# Patient Record
Sex: Female | Born: 1990 | Hispanic: No | Marital: Single | Smoking: Former smoker
Health system: Southern US, Community
[De-identification: ages and names within clinical notes are randomized; demographics above are authoritative.]

## PROBLEM LIST (undated history)

## (undated) DIAGNOSIS — F419 Anxiety disorder, unspecified: Secondary | ICD-10-CM

## (undated) DIAGNOSIS — J45909 Unspecified asthma, uncomplicated: Secondary | ICD-10-CM

## (undated) DIAGNOSIS — F32A Depression, unspecified: Secondary | ICD-10-CM

## (undated) DIAGNOSIS — T7840XA Allergy, unspecified, initial encounter: Secondary | ICD-10-CM

## (undated) DIAGNOSIS — F329 Major depressive disorder, single episode, unspecified: Secondary | ICD-10-CM

## (undated) HISTORY — DX: Depression, unspecified: F32.A

## (undated) HISTORY — PX: CERVICAL BIOPSY  W/ LOOP ELECTRODE EXCISION: SUR135

## (undated) HISTORY — DX: Allergy, unspecified, initial encounter: T78.40XA

## (undated) HISTORY — DX: Major depressive disorder, single episode, unspecified: F32.9

## (undated) HISTORY — DX: Unspecified asthma, uncomplicated: J45.909

---

## 2008-04-08 ENCOUNTER — Emergency Department (HOSPITAL_COMMUNITY): Admission: EM | Admit: 2008-04-08 | Discharge: 2008-04-08 | Payer: Self-pay | Admitting: Emergency Medicine

## 2008-05-27 ENCOUNTER — Emergency Department (HOSPITAL_COMMUNITY): Admission: EM | Admit: 2008-05-27 | Discharge: 2008-05-27 | Payer: Self-pay | Admitting: Emergency Medicine

## 2011-04-25 LAB — URINALYSIS, ROUTINE W REFLEX MICROSCOPIC
Glucose, UA: NEGATIVE
Hgb urine dipstick: NEGATIVE
Specific Gravity, Urine: 1.01

## 2011-04-25 LAB — PREGNANCY, URINE: Preg Test, Ur: NEGATIVE

## 2015-08-10 ENCOUNTER — Ambulatory Visit (INDEPENDENT_AMBULATORY_CARE_PROVIDER_SITE_OTHER): Payer: BLUE CROSS/BLUE SHIELD | Admitting: Emergency Medicine

## 2015-08-10 VITALS — BP 118/76 | HR 78 | Temp 98.2°F | Resp 16 | Ht 61.0 in | Wt 130.0 lb

## 2015-08-10 DIAGNOSIS — H60391 Other infective otitis externa, right ear: Secondary | ICD-10-CM

## 2015-08-10 MED ORDER — HYDROCODONE-ACETAMINOPHEN 5-325 MG PO TABS
1.0000 | ORAL_TABLET | ORAL | Status: DC | PRN
Start: 2015-08-10 — End: 2016-09-26

## 2015-08-10 MED ORDER — MUPIROCIN 2 % EX OINT: 1.0000 "application " | TOPICAL_OINTMENT | Freq: Three times a day (TID) | CUTANEOUS | Status: DC

## 2015-08-10 MED ORDER — TRIAMCINOLONE ACETONIDE 0.1 % EX CREA: 1.0000 "application " | TOPICAL_CREAM | Freq: Two times a day (BID) | CUTANEOUS | Status: DC

## 2015-08-10 NOTE — Progress Notes (Signed)
Subjective:  Patient ID: Sally Cardenas, female    DOB: [birth year]  Age: 25 y.o. MRN: [id number]  CC: Ear Pain   HPI Sally Cardenas presents   With pain in her right ear. She has a history of eczema and think she has eczema right ear canal that she's been instrumenting with Q-tips in her finger and is now quite painful and tender. As she postnasal drainage or fever. She has no cough wheezing or shortness of breath. No drainage from her ear.  History Sally Cardenas has a past medical history of Allergy; Asthma; and Depression.   She has no past surgical history on file.   Her  family history includes Cancer in her father; Hyperlipidemia in her mother; Hypertension in her mother.  She   reports that she quit smoking about 4 weeks ago. She has never used smokeless tobacco. She reports that she does not drink alcohol or use illicit drugs.  No outpatient prescriptions prior to visit.   No facility-administered medications prior to visit.    Social History   Social History  . Marital Status: Single    Spouse Name: N/A  . Number of Children: N/A  . Years of Education: N/A   Social History Main Topics  . Smoking status: Former Smoker    Quit date: 07/10/2015  . Smokeless tobacco: Never Used  . Alcohol Use: No  . Drug Use: No  . Sexual Activity: Not Asked   Other Topics Concern  . None   Social History Narrative  . None     Review of Systems  Constitutional: Negative for fever, chills and appetite change.  HENT: Positive for ear pain. Negative for congestion, ear discharge, postnasal drip, sinus pressure and sore throat.   Eyes: Negative for pain and redness.  Respiratory: Negative for cough, shortness of breath and wheezing.   Cardiovascular: Negative for leg swelling.  Gastrointestinal: Negative for nausea, vomiting, abdominal pain, diarrhea, constipation and blood in stool.  Endocrine: Negative for polyuria.  Genitourinary: Negative for dysuria, urgency, frequency  and flank pain.  Musculoskeletal: Negative for gait problem.  Skin: Negative for rash.  Neurological: Negative for weakness and headaches.  Psychiatric/Behavioral: Negative for confusion and decreased concentration. The patient is not nervous/anxious.     Objective:  BP 118/76 mmHg  Pulse 78  Temp(Src) 98.2 F (36.8 C) (Oral)  Resp 16  Ht 5\' 1"  (1.549 m)  Wt 130 lb (58.968 kg)  BMI 24.58 kg/m2  SpO2 98%  LMP 06/28/2015 (Approximate)  Physical Exam  Constitutional: She is oriented to person, place, and time. She appears well-developed and well-nourished. No distress.  HENT:  Head: Normocephalic and atraumatic.  Right Ear: External ear normal.  Left Ear: External ear normal.  Nose: Nose normal.   She has an area of swelling and involving the tragus. She also has an area at 2:00 on her external ear canal of swelling and scaling and crusting. Her drum itself is normal. The lesion in her ear is lesion her ear is very tender  Eyes: Conjunctivae and EOM are normal. Pupils are equal, round, and reactive to light. No scleral icterus.  Neck: Normal range of motion. Neck supple. No tracheal deviation present.  Cardiovascular: Normal rate, regular rhythm and normal heart sounds.   Pulmonary/Chest: Effort normal. No respiratory distress. She has no wheezes. She has no rales.  Abdominal: She exhibits no mass. There is no tenderness. There is no rebound and no guarding.  Musculoskeletal: She exhibits no edema.  Lymphadenopathy:  She has no cervical adenopathy.  Neurological: She is alert and oriented to person, place, and time. Coordination normal.  Skin: Skin is warm and dry. No rash noted.  Psychiatric: She has a normal mood and affect. Her behavior is normal.      Assessment & Plan:   Sally CornfieldStephanie was seen today for ear pain.  Diagnoses and all orders for this visit:  Otitis, externa, infective, right  Other orders -     HYDROcodone-acetaminophen (NORCO) 5-325 MG tablet; Take  1-2 tablets by mouth every 4 (four) hours as needed. -     mupirocin ointment (BACTROBAN) 2 %; Apply 1 application topically 3 (three) times daily. -     triamcinolone cream (KENALOG) 0.1 %; Apply 1 application topically 2 (two) times daily.   I am having Ms. Sally Normanran start on HYDROcodone-acetaminophen, mupirocin ointment, and triamcinolone cream. I am also having her maintain her albuterol, escitalopram, montelukast, and Fluticasone-Salmeterol.  Meds ordered this encounter  Medications  . albuterol (PROVENTIL HFA;VENTOLIN HFA) 108 (90 Base) MCG/ACT inhaler    Sig: Inhale into the lungs every 6 (six) hours as needed for wheezing or shortness of breath.  . escitalopram (LEXAPRO) 10 MG tablet    Sig: Take 10 mg by mouth daily.  . montelukast (SINGULAIR) 10 MG tablet    Sig: Take 10 mg by mouth at bedtime.  . Fluticasone-Salmeterol (ADVAIR) 250-50 MCG/DOSE AEPB    Sig: Inhale 1 puff into the lungs 2 (two) times daily.  Marland Kitchen. HYDROcodone-acetaminophen (NORCO) 5-325 MG tablet    Sig: Take 1-2 tablets by mouth every 4 (four) hours as needed.    Dispense:  20 tablet    Refill:  0  . mupirocin ointment (BACTROBAN) 2 %    Sig: Apply 1 application topically 3 (three) times daily.    Dispense:  22 g    Refill:  1  . triamcinolone cream (KENALOG) 0.1 %    Sig: Apply 1 application topically 2 (two) times daily.    Dispense:  30 g    Refill:  0    Appropriate red flag conditions were discussed with the patient as well as actions that should be taken.  Patient expressed his understanding.  Follow-up: Return if symptoms worsen or fail to improve.  Carmelina DaneAnderson, Maely Clements S, MD

## 2015-08-10 NOTE — Patient Instructions (Signed)

## 2016-09-25 ENCOUNTER — Encounter (HOSPITAL_COMMUNITY): Payer: Self-pay | Admitting: *Deleted

## 2016-09-25 ENCOUNTER — Observation Stay (HOSPITAL_COMMUNITY)
Admission: AD | Admit: 2016-09-25 | Discharge: 2016-09-26 | Disposition: A | Discharge status: Home / Self Care | Payer: BLUE CROSS/BLUE SHIELD | Source: Ambulatory Visit | Attending: Obstetrics and Gynecology | Admitting: Obstetrics and Gynecology

## 2016-09-25 DIAGNOSIS — J45909 Unspecified asthma, uncomplicated: Secondary | ICD-10-CM | POA: Insufficient documentation

## 2016-09-25 DIAGNOSIS — Z87891 Personal history of nicotine dependence: Secondary | ICD-10-CM | POA: Insufficient documentation

## 2016-09-25 DIAGNOSIS — N939 Abnormal uterine and vaginal bleeding, unspecified: Principal | ICD-10-CM | POA: Diagnosis present

## 2016-09-25 DIAGNOSIS — Z9889 Other specified postprocedural states: Secondary | ICD-10-CM

## 2016-09-25 DIAGNOSIS — N888 Other specified noninflammatory disorders of cervix uteri: Secondary | ICD-10-CM

## 2016-09-25 DIAGNOSIS — Z79899 Other long term (current) drug therapy: Secondary | ICD-10-CM | POA: Insufficient documentation

## 2016-09-25 NOTE — MAU Note (Signed)
Pt states she had a LEEP done on Friday by Dr. Sallye OberKulwa, states she thinks she started period next day, but unsure due to the bleeding from procedure. Pt was due for period around the same time. States for the last couple of hours she has been changing pad every 30-45 mins. Passed a clot the size of a small orange and a few small clots. Pt denies pain, just some light cramping-rates 2/10.

## 2016-09-26 ENCOUNTER — Observation Stay (HOSPITAL_COMMUNITY): Payer: BLUE CROSS/BLUE SHIELD

## 2016-09-26 DIAGNOSIS — N939 Abnormal uterine and vaginal bleeding, unspecified: Secondary | ICD-10-CM

## 2016-09-26 DIAGNOSIS — J45909 Unspecified asthma, uncomplicated: Secondary | ICD-10-CM | POA: Diagnosis not present

## 2016-09-26 DIAGNOSIS — Z79899 Other long term (current) drug therapy: Secondary | ICD-10-CM | POA: Diagnosis not present

## 2016-09-26 DIAGNOSIS — Z87891 Personal history of nicotine dependence: Secondary | ICD-10-CM | POA: Diagnosis not present

## 2016-09-26 HISTORY — DX: Abnormal uterine and vaginal bleeding, unspecified: N93.9

## 2016-09-26 LAB — URINALYSIS, ROUTINE W REFLEX MICROSCOPIC
Bilirubin Urine: NEGATIVE
GLUCOSE, UA: NEGATIVE mg/dL
Ketones, ur: NEGATIVE mg/dL
LEUKOCYTES UA: NEGATIVE
Nitrite: NEGATIVE
PROTEIN: NEGATIVE mg/dL
SPECIFIC GRAVITY, URINE: 1.02 (ref 1.005–1.030)
pH: 5.5 (ref 5.0–8.0)

## 2016-09-26 LAB — CBC
HEMATOCRIT: 30.7 % — AB (ref 36.0–46.0)
HEMATOCRIT: 33.4 % — AB (ref 36.0–46.0)
HEMOGLOBIN: 11.4 g/dL — AB (ref 12.0–15.0)
Hemoglobin: 10.7 g/dL — ABNORMAL LOW (ref 12.0–15.0)
MCH: 26.3 pg (ref 26.0–34.0)
MCH: 27.3 pg (ref 26.0–34.0)
MCHC: 34.1 g/dL (ref 30.0–36.0)
MCHC: 34.9 g/dL (ref 30.0–36.0)
MCV: 77 fL — ABNORMAL LOW (ref 78.0–100.0)
MCV: 78.3 fL (ref 78.0–100.0)
Platelets: 152 10*3/uL (ref 150–400)
Platelets: 167 10*3/uL (ref 150–400)
RBC: 3.92 MIL/uL (ref 3.87–5.11)
RBC: 4.34 MIL/uL (ref 3.87–5.11)
RDW: 14.3 % (ref 11.5–15.5)
RDW: 14.3 % (ref 11.5–15.5)
WBC: 11.9 10*3/uL — AB (ref 4.0–10.5)
WBC: 13.2 10*3/uL — ABNORMAL HIGH (ref 4.0–10.5)

## 2016-09-26 LAB — URINALYSIS, MICROSCOPIC (REFLEX)

## 2016-09-26 MED ORDER — FERRIC SUBSULFATE 259 MG/GM EX SOLN
CUTANEOUS | Status: AC
  Filled 2016-09-26: qty 8

## 2016-09-26 MED ORDER — FERRIC SUBSULFATE 259 MG/GM EX SOLN
CUTANEOUS | Status: AC
  Filled 2016-09-26: qty 24

## 2016-09-26 MED ORDER — OXYCODONE-ACETAMINOPHEN 5-325 MG PO TABS
1.0000 | ORAL_TABLET | ORAL | Status: DC | PRN
  Administered 2016-09-26: 1 via ORAL
  Filled 2016-09-26: qty 1

## 2016-09-26 MED ORDER — ONDANSETRON HCL 4 MG/2ML IJ SOLN: 4.0000 mg | Freq: Four times a day (QID) | INTRAMUSCULAR | Status: DC | PRN

## 2016-09-26 MED ORDER — TRANEXAMIC ACID 650 MG PO TABS
1300.0000 mg | ORAL_TABLET | Freq: Three times a day (TID) | ORAL | Status: DC
  Administered 2016-09-26 (×2): 1300 mg via ORAL
  Filled 2016-09-26 (×5): qty 2

## 2016-09-26 MED ORDER — OXYCODONE-ACETAMINOPHEN 5-325 MG PO TABS: 1.0000 | ORAL_TABLET | ORAL | Status: DC | PRN

## 2016-09-26 MED ORDER — ONDANSETRON HCL 4 MG PO TABS: 4.0000 mg | ORAL_TABLET | Freq: Four times a day (QID) | ORAL | Status: DC | PRN

## 2016-09-26 MED ORDER — SENNA 8.6 MG PO TABS
1.0000 | ORAL_TABLET | Freq: Two times a day (BID) | ORAL | Status: DC
  Administered 2016-09-26: 8.6 mg via ORAL
  Filled 2016-09-26 (×5): qty 1

## 2016-09-26 NOTE — Progress Notes (Signed)
Pt has had no bleeding since removal of vaginal packing by Dr. Su Hiltoberts at around 11am. Pt has no complaints. Pt verbalizes understanding of d/c instructions, medications, follow up appts, when to seek medical attention, belongings policy. Pt has no questions at this time. Pt was given a copy of d/c instructions when she left. Pt and her SO ambulated to main entrance, I accompanied them without complication. Pt walked from main entrance to their vehicle at her request, which was close. Sheryn BisonGordon, Aarya Quebedeaux Warner

## 2016-09-26 NOTE — Discharge Summary (Signed)
Physician Discharge Summary  Patient ID: Sally NakayamaStephanie Cardenas MRN: [id number] DOB/AGE: 1991-02-28 26 y.o.  Admit date: 09/25/2016 Discharge date: 09/26/2016  Admission Diagnoses: Vaginal bleeding S/p LEEP on Friday  Discharge Diagnoses:  Active Problems:   Vaginal bleeding s/p LEEP  Discharged Condition: good  Hospital Course: Pt was observed in the hospital after monsels and vaginal packing were placed in the vagina for hemostasis.  Upon removal of packing about 8hrs later, there was minimal bleeding and packing had minimal bright blood on it and mostly dark old blood.  The patient was on her cycle but no active bleeding noted at the time of removal of the vaginal packing.  Consults: None  Significant Diagnostic Studies: 10.7 Hgb  Treatments: observation and vaginal packing  Discharge Exam: Blood pressure 111/76, pulse 63, temperature 98.2 F (36.8 C), temperature source Oral, resp. rate 16, height 5' (1.524 m), weight 120 lb 8 oz (54.7 kg), last menstrual period 09/24/2016, SpO2 100 %. General appearance: alert and no distress Resp: clear to auscultation bilaterally Cardio: regular rate and rhythm Pelvic: cervix normal in appearance, external genitalia normal, no adnexal masses or tenderness, no cervical motion tenderness, rectovaginal septum normal, uterus normal size, shape, and consistency, vagina normal without discharge and packing removed and no active bleeding noted Extremities: Homans sign is negative, no sign of DVT  Disposition: stable   Allergies as of 09/26/2016   No Known Allergies     Medication List    STOP taking these medications   Fluticasone-Salmeterol 250-50 MCG/DOSE Aepb Commonly known as:  ADVAIR   HYDROcodone-acetaminophen 5-325 MG tablet Commonly known as:  NORCO     TAKE these medications   albuterol 108 (90 Base) MCG/ACT inhaler Commonly known as:  PROVENTIL HFA;VENTOLIN HFA Inhale into the lungs every 6 (six) hours as needed for wheezing or  shortness of breath.   escitalopram 10 MG tablet Commonly known as:  LEXAPRO Take 10 mg by mouth daily.   ibuprofen 800 MG tablet Commonly known as:  ADVIL,MOTRIN Take 800 mg by mouth every 8 (eight) hours as needed.   montelukast 10 MG tablet Commonly known as:  SINGULAIR Take 10 mg by mouth at bedtime.   mupirocin ointment 2 % Commonly known as:  BACTROBAN Apply 1 application topically 3 (three) times daily.   triamcinolone cream 0.1 % Commonly known as:  KENALOG Apply 1 application topically 2 (two) times daily.        SignedPurcell Nails: Johnatan Baskette Y 09/26/2016, 11:00 AM

## 2016-09-26 NOTE — MAU Provider Note (Signed)
Chief Complaint: Vaginal Bleeding   First Provider Initiated Contact with Patient 09/26/16 0042      SUBJECTIVE HPI: Sally Cardenas is a 26 y.o. G0P0000 who is s/p LEEP on 09/23/16 presents to maternity admissions reporting onset of vaginal bleeding on 09/24/16 becoming much heavier today. This is a new symptom.  She reports her menses were due but she never has bleeding this heavy.  She reports passing one orange sized clot and several smaller clots and has changed her pad every 30-45 minutes since 8 pm today.  She has not tried any treatments and nothing makes the bleeding better or worse.  It has no associated symptoms. She denies abdominal pain.  She denies h/a, dizziness, chest pain, or palpitations. She denies vaginal itching/burning, urinary symptoms, n/v, or fever/chills.     HPI  Past Medical History:  Diagnosis Date  . Allergy   . Asthma   . Depression    Past Surgical History:  Procedure Laterality Date  . CERVICAL BIOPSY  W/ LOOP ELECTRODE EXCISION     Social History   Social History  . Marital status: Single    Spouse name: N/A  . Number of children: N/A  . Years of education: N/A   Occupational History  . Not on file.   Social History Main Topics  . Smoking status: Former Smoker    Quit date: 07/10/2015  . Smokeless tobacco: Never Used  . Alcohol use No  . Drug use: No  . Sexual activity: Not on file     Comment: last sex Sep 21, 2016   Other Topics Concern  . Not on file   Social History Narrative  . No narrative on file   No current facility-administered medications on file prior to encounter.    Current Outpatient Prescriptions on File Prior to Encounter  Medication Sig Dispense Refill  . albuterol (PROVENTIL HFA;VENTOLIN HFA) 108 (90 Base) MCG/ACT inhaler Inhale into the lungs every 6 (six) hours as needed for wheezing or shortness of breath.    . escitalopram (LEXAPRO) 10 MG tablet Take 10 mg by mouth daily.    . montelukast (SINGULAIR) 10 MG  tablet Take 10 mg by mouth at bedtime.    . triamcinolone cream (KENALOG) 0.1 % Apply 1 application topically 2 (two) times daily. 30 g 0  . Fluticasone-Salmeterol (ADVAIR) 250-50 MCG/DOSE AEPB Inhale 1 puff into the lungs 2 (two) times daily.    Marland Kitchen HYDROcodone-acetaminophen (NORCO) 5-325 MG tablet Take 1-2 tablets by mouth every 4 (four) hours as needed. 20 tablet 0  . mupirocin ointment (BACTROBAN) 2 % Apply 1 application topically 3 (three) times daily. 22 g 1   No Known Allergies  ROS:  Review of Systems  Constitutional: Negative for chills, fatigue and fever.  Respiratory: Negative for shortness of breath.   Cardiovascular: Negative for chest pain.  Gastrointestinal: Negative for nausea and vomiting.  Genitourinary: Positive for vaginal bleeding. Negative for difficulty urinating, dysuria, flank pain, pelvic pain, vaginal discharge and vaginal pain.  Neurological: Negative for dizziness and headaches.  Psychiatric/Behavioral: Negative.      I have reviewed patient's Past Medical Hx, Surgical Hx, Family Hx, Social Hx, medications and allergies.   Physical Exam   Patient Vitals for the past 24 hrs:  BP Temp Temp src Pulse Resp SpO2  09/26/16 0122 125/84 - - 66 - -  09/25/16 2255 146/97 - - 75 - -  09/25/16 2252 132/91 - - 82 16 -  09/25/16 2226 158/92 98.2 F (  36.8 C) Oral 81 20 99 %   Constitutional: Well-developed, well-nourished female in no acute distress.  Cardiovascular: normal rate Respiratory: normal effort GI: Abd soft, non-tender. Pos BS x 4 MS: Extremities nontender, no edema, normal ROM Neurologic: Alert and oriented x 4.  GU: Neg CVAT.  PELVIC EXAM: Cervix with excoriated tissue c/w recent LEEP and visual seeping of blood from at LEEP bed, baseball sized clot removed and large amount dark red bleeding swabbed with large cotton swabs before cervix visualized, vaginal walls and external genitalia normal  2 Pads soaked in 1 hour in MAU with clots  LAB  RESULTS Results for orders placed or performed during the hospital encounter of 09/25/16 (from the past 24 hour(s))  CBC     Status: Abnormal   Collection Time: 09/26/16 12:42 AM  Result Value Ref Range   WBC 11.9 (H) 4.0 - 10.5 K/uL   RBC 4.34 3.87 - 5.11 MIL/uL   Hemoglobin 11.4 (L) 12.0 - 15.0 g/dL   HCT 40.933.4 (L) 81.136.0 - 91.446.0 %   MCV 77.0 (L) 78.0 - 100.0 fL   MCH 26.3 26.0 - 34.0 pg   MCHC 34.1 30.0 - 36.0 g/dL   RDW 78.214.3 95.611.5 - 21.315.5 %   Platelets 167 150 - 400 K/uL       IMAGING No results found.  MAU Management/MDM: Ordered labs and reviewed results.  Pt has no s/sx of anema with Hgb 11.4. Pt stable at this time.  After pelvic exam, consult Dr Richardson Doppole. Dr Richardson Doppole to bedside to evaluate.    A: 1. Bleeding of cervix   2. Status post LEEP (loop electrosurgical excision procedure) of cervix    P:  Dr Richardson Doppole at beside  [name] Certified Nurse-Midwife 09/26/2016  1:29 AM

## 2016-09-26 NOTE — Discharge Instructions (Signed)
Call with bleeding heavier than a normal period Call with lightheadedness or dizziness Call with fever D/C instructions reviewed F/u with Dr. Sallye OberKulwa as instructed

## 2016-09-26 NOTE — H&P (Signed)
Chief Complaint: Vaginal Bleeding   First Provider Initiated Contact with Patient 09/26/16 0042      SUBJECTIVE HPI: Sally Cardenas is a 26 y.o. G0P0000 who is s/p LEEP on 09/23/16 presents to maternity admissions reporting onset of vaginal bleeding on 09/24/16 becoming much heavier today. This is a new symptom.  She reports her menses were due but she never has bleeding this heavy.  She reports passing one orange sized clot and several smaller clots and has changed her pad every 30-45 minutes since 8 pm today.  She has not tried any treatments and nothing makes the bleeding better or worse.  It has no associated symptoms. She denies abdominal pain.  She denies h/a, dizziness, chest pain, or palpitations. She denies vaginal itching/burning, urinary symptoms, n/v, or fever/chills.     HPI      Past Medical History:  Diagnosis Date  . Allergy   . Asthma   . Depression         Past Surgical History:  Procedure Laterality Date  . CERVICAL BIOPSY  W/ LOOP ELECTRODE EXCISION     Social History        Social History  . Marital status: Single    Spouse name: N/A  . Number of children: N/A  . Years of education: N/A      Occupational History  . Not on file.         Social History Main Topics  . Smoking status: Former Smoker    Quit date: 07/10/2015  . Smokeless tobacco: Never Used  . Alcohol use No  . Drug use: No  . Sexual activity: Not on file     Comment: last sex Sep 21, 2016       Other Topics Concern  . Not on file      Social History Narrative  . No narrative on file   No current facility-administered medications on file prior to encounter.          Current Outpatient Prescriptions on File Prior to Encounter  Medication Sig Dispense Refill  . albuterol (PROVENTIL HFA;VENTOLIN HFA) 108 (90 Base) MCG/ACT inhaler Inhale into the lungs every 6 (six) hours as needed for wheezing or shortness of breath.    . escitalopram (LEXAPRO) 10 MG  tablet Take 10 mg by mouth daily.    . montelukast (SINGULAIR) 10 MG tablet Take 10 mg by mouth at bedtime.    . triamcinolone cream (KENALOG) 0.1 % Apply 1 application topically 2 (two) times daily. 30 g 0  . Fluticasone-Salmeterol (ADVAIR) 250-50 MCG/DOSE AEPB Inhale 1 puff into the lungs 2 (two) times daily.    Marland Kitchen HYDROcodone-acetaminophen (NORCO) 5-325 MG tablet Take 1-2 tablets by mouth every 4 (four) hours as needed. 20 tablet 0  . mupirocin ointment (BACTROBAN) 2 % Apply 1 application topically 3 (three) times daily. 22 g 1   No Known Allergies  ROS:  Review of Systems  Constitutional: Negative for chills, fatigue and fever.  Respiratory: Negative for shortness of breath.   Cardiovascular: Negative for chest pain.  Gastrointestinal: Negative for nausea and vomiting.  Genitourinary: Positive for vaginal bleeding. Negative for difficulty urinating, dysuria, flank pain, pelvic pain, vaginal discharge and vaginal pain.  Neurological: Negative for dizziness and headaches.  Psychiatric/Behavioral: Negative.      I have reviewed patient's Past Medical Hx, Surgical Hx, Family Hx, Social Hx, medications and allergies.   Physical Exam   Patient Vitals for the past 24 hrs:  BP Temp Temp src  Pulse Resp SpO2  09/26/16 0122 125/84 - - 66 - -  09/25/16 2255 146/97 - - 75 - -  09/25/16 2252 132/91 - - 82 16 -  09/25/16 2226 158/92 98.2 F (36.8 C) Oral 81 20 99 %   Constitutional: Well-developed, well-nourished female in no acute distress.  Cardiovascular: normal rate Respiratory: normal effort GI: Abd soft, non-tender. Pos BS x 4 MS: Extremities nontender, no edema, normal ROM Neurologic: Alert and oriented x 4.  GU: Neg CVAT.  PELVIC EXAM: Cervix with excoriated tissue c/w recent LEEP and visual seeping of blood from at LEEP bed, baseball sized clot removed and large amount dark red bleeding swabbed with large cotton swabs before cervix visualized, vaginal walls and  external genitalia normal  2 Pads soaked in 1 hour in MAU with clots  LAB RESULTS Lab Results Last 24 Hours       Results for orders placed or performed during the hospital encounter of 09/25/16 (from the past 24 hour(s))  CBC     Status: Abnormal   Collection Time: 09/26/16 12:42 AM  Result Value Ref Range   WBC 11.9 (H) 4.0 - 10.5 K/uL   RBC 4.34 3.87 - 5.11 MIL/uL   Hemoglobin 11.4 (L) 12.0 - 15.0 g/dL   HCT 04.533.4 (L) 40.936.0 - 81.146.0 %   MCV 77.0 (L) 78.0 - 100.0 fL   MCH 26.3 26.0 - 34.0 pg   MCHC 34.1 30.0 - 36.0 g/dL   RDW 91.414.3 78.211.5 - 95.615.5 %   Platelets 167 150 - 400 K/uL       IMAGING Imaging Results  No results found.    MAU Management/MDM: Ordered labs and reviewed results.  Pt has no s/sx of anema with Hgb 11.4. Pt stable at this time.  After pelvic exam, consult Dr Richardson Doppole. Dr Richardson Doppole to bedside to evaluate.   A: 1. Bleeding of cervix   2. Status post LEEP (loop electrosurgical excision procedure) of cervix    P:  Dr Richardson Doppole at beside  [name] Certified Nurse-Midwife 09/26/2016  1:29 AM Chief Complaint: Vaginal Bleeding   First Provider Initiated Contact with Patient 09/26/16 0042      SUBJECTIVE HPI: Sally Cardenas is a 26 y.o. G0P0000 who is s/p LEEP on 09/23/16 presents to maternity admissions reporting onset of vaginal bleeding on 09/24/16 becoming much heavier today. This is a new symptom.  She reports her menses were due but she never has bleeding this heavy.  She reports passing one orange sized clot and several smaller clots and has changed her pad every 30-45 minutes since 8 pm today.  She has not tried any treatments and nothing makes the bleeding better or worse.  It has no associated symptoms. She denies abdominal pain.  She denies h/a, dizziness, chest pain, or palpitations. She denies vaginal itching/burning, urinary symptoms, n/v, or fever/chills.     HPI      Past Medical History:  Diagnosis Date  . Allergy   .  Asthma   . Depression         Past Surgical History:  Procedure Laterality Date  . CERVICAL BIOPSY  W/ LOOP ELECTRODE EXCISION     Social History        Social History  . Marital status: Single    Spouse name: N/A  . Number of children: N/A  . Years of education: N/A      Occupational History  . Not on file.  Social History Main Topics  . Smoking status: Former Smoker    Quit date: 07/10/2015  . Smokeless tobacco: Never Used  . Alcohol use No  . Drug use: No  . Sexual activity: Not on file     Comment: last sex Sep 21, 2016       Other Topics Concern  . Not on file      Social History Narrative  . No narrative on file   No current facility-administered medications on file prior to encounter.          Current Outpatient Prescriptions on File Prior to Encounter  Medication Sig Dispense Refill  . albuterol (PROVENTIL HFA;VENTOLIN HFA) 108 (90 Base) MCG/ACT inhaler Inhale into the lungs every 6 (six) hours as needed for wheezing or shortness of breath.    . escitalopram (LEXAPRO) 10 MG tablet Take 10 mg by mouth daily.    . montelukast (SINGULAIR) 10 MG tablet Take 10 mg by mouth at bedtime.    . triamcinolone cream (KENALOG) 0.1 % Apply 1 application topically 2 (two) times daily. 30 g 0  . Fluticasone-Salmeterol (ADVAIR) 250-50 MCG/DOSE AEPB Inhale 1 puff into the lungs 2 (two) times daily.    Marland Kitchen HYDROcodone-acetaminophen (NORCO) 5-325 MG tablet Take 1-2 tablets by mouth every 4 (four) hours as needed. 20 tablet 0  . mupirocin ointment (BACTROBAN) 2 % Apply 1 application topically 3 (three) times daily. 22 g 1   No Known Allergies  ROS:  Review of Systems  Constitutional: Negative for chills, fatigue and fever.  Respiratory: Negative for shortness of breath.   Cardiovascular: Negative for chest pain.  Gastrointestinal: Negative for nausea and vomiting.  Genitourinary: Positive for vaginal bleeding. Negative for difficulty  urinating, dysuria, flank pain, pelvic pain, vaginal discharge and vaginal pain.  Neurological: Negative for dizziness and headaches.  Psychiatric/Behavioral: Negative.      I have reviewed patient's Past Medical Hx, Surgical Hx, Family Hx, Social Hx, medications and allergies.   Physical Exam   Patient Vitals for the past 24 hrs:  BP Temp Temp src Pulse Resp SpO2  09/26/16 0122 125/84 - - 66 - -  09/25/16 2255 146/97 - - 75 - -  09/25/16 2252 132/91 - - 82 16 -  09/25/16 2226 158/92 98.2 F (36.8 C) Oral 81 20 99 %   Constitutional: Well-developed, well-nourished female in no acute distress.  Cardiovascular: normal rate Respiratory: normal effort GI: Abd soft, non-tender. Pos BS x 4 MS: Extremities nontender, no edema, normal ROM Neurologic: Alert and oriented x 4.  GU: Neg CVAT.  PELVIC EXAM: Cervix with excoriated tissue c/w recent LEEP and visual seeping of blood from at LEEP bed, baseball sized clot removed and large amount dark red bleeding swabbed with large cotton swabs before cervix visualized, vaginal walls and external genitalia normal  2 Pads soaked in 1 hour in MAU with clots  LAB RESULTS Lab Results Last 24 Hours       Results for orders placed or performed during the hospital encounter of 09/25/16 (from the past 24 hour(s))  CBC     Status: Abnormal   Collection Time: 09/26/16 12:42 AM  Result Value Ref Range   WBC 11.9 (H) 4.0 - 10.5 K/uL   RBC 4.34 3.87 - 5.11 MIL/uL   Hemoglobin 11.4 (L) 12.0 - 15.0 g/dL   HCT 19.1 (L) 47.8 - 29.5 %   MCV 77.0 (L) 78.0 - 100.0 fL   MCH 26.3 26.0 - 34.0 pg  MCHC 34.1 30.0 - 36.0 g/dL   RDW 29.5 62.1 - 30.8 %   Platelets 167 150 - 400 K/uL       IMAGING Imaging Results  No results found.    MAU Management/MDM: Ordered labs and reviewed results.  Pt has no s/sx of anema with Hgb 11.4. Pt stable at this time.  After pelvic exam, consult Dr Richardson Dopp. Dr Richardson Dopp to bedside to evaluate.   A: 1.  Bleeding of cervix   2. Status post LEEP (loop electrosurgical excision procedure) of cervix    P:  Dr Richardson Dopp at beside  Sharen Counter Certified Nurse-Midwife 09/26/2016  1:29 AM  Patient seen and examined in MAU. Pelvic exam performed. Large clot evacuated from vagina. Cervical bed appears to be bleeding from posterior aspect as well as from the os.  Monsels was applied and pressure held for 10 minutes with improvement in bleeding. Bleeding re-occured. Monsels applied again with slight improvement in her bleeding. Packing with monsels and 1/2 inch packing placed in cervical bed.   A/P Vaginal bleeding... Uterine vs cervical...  Etiology believed to be from posterior cervix.. Vaginal packing in place.  Will place in observation.  Menses- pelvic ultasound.... Lysteda in the event bleeding is uterine. Pain control- Will monitor closely .  Repeat CBC in 6 hours.  Dr. Su Hilt assuming care at 7am this morning.

## 2018-04-21 ENCOUNTER — Encounter (HOSPITAL_COMMUNITY): Payer: Self-pay

## 2018-04-21 ENCOUNTER — Inpatient Hospital Stay (HOSPITAL_COMMUNITY)
Admission: AD | Admit: 2018-04-21 | Discharge: 2018-04-21 | Disposition: A | Discharge status: Home / Self Care | Payer: BLUE CROSS/BLUE SHIELD | Source: Ambulatory Visit | Attending: Obstetrics & Gynecology | Admitting: Obstetrics & Gynecology

## 2018-04-21 ENCOUNTER — Inpatient Hospital Stay (HOSPITAL_COMMUNITY): Payer: BLUE CROSS/BLUE SHIELD

## 2018-04-21 DIAGNOSIS — O021 Missed abortion: Secondary | ICD-10-CM | POA: Diagnosis not present

## 2018-04-21 DIAGNOSIS — Z87891 Personal history of nicotine dependence: Secondary | ICD-10-CM | POA: Diagnosis not present

## 2018-04-21 DIAGNOSIS — O209 Hemorrhage in early pregnancy, unspecified: Secondary | ICD-10-CM | POA: Diagnosis present

## 2018-04-21 DIAGNOSIS — Z3A10 10 weeks gestation of pregnancy: Secondary | ICD-10-CM | POA: Insufficient documentation

## 2018-04-21 LAB — URINALYSIS, ROUTINE W REFLEX MICROSCOPIC
BILIRUBIN URINE: NEGATIVE
Glucose, UA: NEGATIVE mg/dL
KETONES UR: NEGATIVE mg/dL
NITRITE: NEGATIVE
PH: 7 (ref 5.0–8.0)
PROTEIN: NEGATIVE mg/dL
Specific Gravity, Urine: 1.002 — ABNORMAL LOW (ref 1.005–1.030)

## 2018-04-21 LAB — POCT PREGNANCY, URINE: Preg Test, Ur: POSITIVE — AB

## 2018-04-21 NOTE — MAU Note (Signed)
Vaginal bleeding since yesterday at 10, was very light and stopped  This afternoon woke up from a nap and saw some more blood, it was more red then yesterday  No pain  No recent IC

## 2018-04-21 NOTE — MAU Provider Note (Signed)
Chief Complaint: Vaginal Bleeding   None    SUBJECTIVE HPI: Sally Cardenas is a 27 y.o. G1P0000 at [redacted]w[redacted]d who presents to Maternity Admissions reporting bleeding that started yesterday evening was light but has increased today. O+  Location: vaginal  Quality: small Severity: 0/10 on pain scale Duration: 2 days Timing: intermittent Modifying factors: none Associated signs and symptoms: none  Past Medical History:  Diagnosis Date  . Allergy   . Asthma   . Depression    OB History  Gravida Para Term Preterm AB Living  1 0 0 0 0 0  SAB TAB Ectopic Multiple Live Births  0 0 0 0 0    # Outcome Date GA Lbr Len/2nd Weight Sex Delivery Anes PTL Lv  1 Current            Past Surgical History:  Procedure Laterality Date  . CERVICAL BIOPSY  W/ LOOP ELECTRODE EXCISION     Social History   Socioeconomic History  . Marital status: Single    Spouse name: Not on file  . Number of children: Not on file  . Years of education: Not on file  . Highest education level: Not on file  Occupational History  . Not on file  Social Needs  . Financial resource strain: Not on file  . Food insecurity:    Worry: Not on file    Inability: Not on file  . Transportation needs:    Medical: Not on file    Non-medical: Not on file  Tobacco Use  . Smoking status: Former Smoker    Last attempt to quit: 07/10/2015    Years since quitting: 2.7  . Smokeless tobacco: Never Used  Substance and Sexual Activity  . Alcohol use: No    Alcohol/week: 0.0 standard drinks  . Drug use: No  . Sexual activity: Not on file  Lifestyle  . Physical activity:    Days per week: Not on file    Minutes per session: Not on file  . Stress: Not on file  Relationships  . Social connections:    Talks on phone: Not on file    Gets together: Not on file    Attends religious service: Not on file    Active member of club or organization: Not on file    Attends meetings of clubs or organizations: Not on file     Relationship status: Not on file  . Intimate partner violence:    Fear of current or ex partner: Not on file    Emotionally abused: Not on file    Physically abused: Not on file    Forced sexual activity: Not on file  Other Topics Concern  . Not on file  Social History Narrative  . Not on file   Family History  Problem Relation Age of Onset  . Hyperlipidemia Mother   . Hypertension Mother   . Cancer Father    No current facility-administered medications on file prior to encounter.    Current Outpatient Medications on File Prior to Encounter  Medication Sig Dispense Refill  . albuterol (PROVENTIL HFA;VENTOLIN HFA) 108 (90 Base) MCG/ACT inhaler Inhale into the lungs every 6 (six) hours as needed for wheezing or shortness of breath.    . escitalopram (LEXAPRO) 10 MG tablet Take 10 mg by mouth daily.    . montelukast (SINGULAIR) 10 MG tablet Take 10 mg by mouth at bedtime.    . Prenatal Vit-Fe Fumarate-FA (PRENATAL MULTIVITAMIN) TABS tablet Take 1 tablet by mouth  daily at 12 noon.    . triamcinolone ointment (KENALOG) 0.1 % Apply 1 application topically continuous as needed.    Marland Kitchen ibuprofen (ADVIL,MOTRIN) 800 MG tablet Take 800 mg by mouth every 8 (eight) hours as needed.    . mupirocin ointment (BACTROBAN) 2 % Apply 1 application topically 3 (three) times daily. 22 g 1  . triamcinolone cream (KENALOG) 0.1 % Apply 1 application topically 2 (two) times daily. 30 g 0   No Known Allergies  I have reviewed patient's Past Medical Hx, Surgical Hx, Family Hx, Social Hx, medications and allergies.   Review of Systems  Constitutional: Negative.   Eyes: Negative.   Respiratory: Negative.   Cardiovascular: Negative.   Gastrointestinal: Positive for abdominal pain.  Endocrine: Negative.   Genitourinary: Positive for vaginal bleeding.  Musculoskeletal: Negative.   Allergic/Immunologic: Negative.   Neurological: Negative.   Hematological: Negative.   Psychiatric/Behavioral: Negative.      OBJECTIVE Patient Vitals for the past 24 hrs:  BP Temp Temp src Pulse Resp  04/21/18 2101 121/87 - - 86 19  04/21/18 1855 110/73 98.7 F (37.1 C) Oral 70 18   Constitutional: Well-developed, well-nourished female in no acute distress.  Cardiovascular: normal rate Respiratory: normal rate and effort.  GI: Abd soft, non-tender, gravid appropriate for gestational age. Pos BS x 4 MS: Extremities nontender, no edema, normal ROM Neurologic: Alert and oriented x 4.  GU: Neg CVAT.   Small amount of bleeding noted.  LAB RESULTS Results for orders placed or performed during the hospital encounter of 04/21/18 (from the past 24 hour(s))  Urinalysis, Routine w reflex microscopic     Status: Abnormal   Collection Time: 04/21/18  6:31 PM  Result Value Ref Range   Color, Urine STRAW (A) YELLOW   APPearance HAZY (A) CLEAR   Specific Gravity, Urine 1.002 (L) 1.005 - 1.030   pH 7.0 5.0 - 8.0   Glucose, UA NEGATIVE NEGATIVE mg/dL   Hgb urine dipstick LARGE (A) NEGATIVE   Bilirubin Urine NEGATIVE NEGATIVE   Ketones, ur NEGATIVE NEGATIVE mg/dL   Protein, ur NEGATIVE NEGATIVE mg/dL   Nitrite NEGATIVE NEGATIVE   Leukocytes, UA TRACE (A) NEGATIVE   RBC / HPF 0-5 0 - 5 RBC/hpf   WBC, UA 0-5 0 - 5 WBC/hpf   Bacteria, UA RARE (A) NONE SEEN   Squamous Epithelial / LPF 0-5 0 - 5  Pregnancy, urine POC     Status: Abnormal   Collection Time: 04/21/18  6:35 PM  Result Value Ref Range   Preg Test, Ur POSITIVE (A) NEGATIVE    IMAGING US Ob Comp Less 14 Wks  Result Date: 04/21/2018 CLINICAL DATA:  Pregnant, intermittent bleeding EXAM: OBSTETRIC <14 WK [place] AND TRANSVAGINAL OB US TECHNIQUE: Both transabdominal and transvaginal ultrasound examinations were performed for complete evaluation of the gestation as well as the maternal uterus, adnexal regions, and pelvic cul-de-sac. Transvaginal technique was performed to assess early pregnancy. COMPARISON:  None. FINDINGS: Intrauterine gestational sac: Single  Yolk sac:  Not Visualized. Embryo:  Visualized. Cardiac Activity: Not Visualized. Heart Rate: 0  bpm CRL:  15.1 mm   7 w   6 d Subchorionic hemorrhage:  Small subchronic hemorrhage. Maternal uterus/adnexae: Bilateral ovaries are within normal limits. No free fluid. IMPRESSION: Single intrauterine gestation, measuring 7 weeks 6 days by crown-rump length, without cardiac activity. Findings meet definitive criteria for failed pregnancy. This follows SRU consensus guidelines: Diagnostic Criteria for Nonviable Pregnancy Early in the First Trimester. N Engl J  Med [phone]. These results were called by telephone at the time of interpretation on 04/21/2018 at 8:32 pm to [facility], provider in MAU, who verbally acknowledged these results. Electronically Signed   By: Charline Bills M.D.   On: 04/21/2018 20:32   US Ob Transvaginal  Result Date: 04/21/2018 CLINICAL DATA:  Pregnant, intermittent bleeding EXAM: OBSTETRIC <14 WK [place] AND TRANSVAGINAL OB US TECHNIQUE: Both transabdominal and transvaginal ultrasound examinations were performed for complete evaluation of the gestation as well as the maternal uterus, adnexal regions, and pelvic cul-de-sac. Transvaginal technique was performed to assess early pregnancy. COMPARISON:  None. FINDINGS: Intrauterine gestational sac: Single Yolk sac:  Not Visualized. Embryo:  Visualized. Cardiac Activity: Not Visualized. Heart Rate: 0  bpm CRL:  15.1 mm   7 w   6 d Subchorionic hemorrhage:  Small subchronic hemorrhage. Maternal uterus/adnexae: Bilateral ovaries are within normal limits. No free fluid. IMPRESSION: Single intrauterine gestation, measuring 7 weeks 6 days by crown-rump length, without cardiac activity. Findings meet definitive criteria for failed pregnancy. This follows SRU consensus guidelines: Diagnostic Criteria for Nonviable Pregnancy Early in the First Trimester. [place] Mis J Med [phone]. These results were called by telephone at the time of interpretation on  04/21/2018 at 8:32 pm to [facility], provider in MAU, who verbally acknowledged these results. Electronically Signed   By: Charline Bills M.D.   On: 04/21/2018 20:32    MAU COURSE Orders Placed This Encounter  Procedures  . US OB Comp Less 14 Wks  . US OB Transvaginal  . Urinalysis, Routine w reflex microscopic  . Pregnancy, urine POC  . Discharge patient Discharge disposition: 01-Home or Self Care; Discharge patient date: 04/21/2018   No orders of the defined types were placed in this encounter.   MDM PE and [place] ASSESSMENT 1. Missed abortion   2. Bleeding in early pregnancy     PLAN Discussed options of expectant management, cytotec vs D and C.  Pt desires D and C.  Support given. Discharge home in stable condition. Bleeding precautions Follow-up Information    [facility] Follow up in 2 day(s).   Specialty:  Obstetrics and Gynecology Contact information: [address] [place] [address] [phone]         Allergies as of 04/21/2018   No Known Allergies     Medication List    TAKE these medications   albuterol 108 (90 Base) MCG/ACT inhaler Commonly known as:  PROVENTIL HFA;VENTOLIN HFA Inhale into the lungs every 6 (six) hours as needed for wheezing or shortness of breath.   escitalopram 10 MG tablet Commonly known as:  LEXAPRO Take 10 mg by mouth daily.   ibuprofen 800 MG tablet Commonly known as:  ADVIL,MOTRIN Take 800 mg by mouth every 8 (eight) hours as needed.   montelukast 10 MG tablet Commonly known as:  SINGULAIR Take 10 mg by mouth at bedtime.   mupirocin ointment 2 % Commonly known as:  BACTROBAN Apply 1 application topically 3 (three) times daily.   prenatal multivitamin Tabs tablet Take 1 tablet by mouth daily at 12 noon.   triamcinolone ointment 0.1 % Commonly known as:  KENALOG Apply 1 application topically continuous as needed.   triamcinolone cream 0.1 % Commonly  known as:  KENALOG Apply 1 application topically 2 (two) times daily.        Kenney Houseman, CNM 04/21/2018  9:03 PM

## 2018-04-23 ENCOUNTER — Encounter (HOSPITAL_COMMUNITY): Payer: Self-pay | Admitting: *Deleted

## 2018-04-23 ENCOUNTER — Other Ambulatory Visit: Payer: Self-pay | Admitting: Obstetrics & Gynecology

## 2018-04-23 NOTE — Anesthesia Preprocedure Evaluation (Signed)
Anesthesia Evaluation  Patient identified by MRN, date of birth, ID band Patient awake    Reviewed: Allergy & Precautions, H&P , NPO status , Patient's Chart, lab work & pertinent test results, reviewed documented beta blocker date and time   Airway Mallampati: II  TM Distance: >3 FB Neck ROM: full    Dental no notable dental hx.    Pulmonary neg pulmonary ROS, former smoker,    Pulmonary exam normal breath sounds clear to auscultation       Cardiovascular Exercise Tolerance: Good negative cardio ROS   Rhythm:regular Rate:Normal     Neuro/Psych negative neurological ROS  negative psych ROS   GI/Hepatic negative GI ROS, Neg liver ROS,   Endo/Other  negative endocrine ROS  Renal/GU negative Renal ROS  negative genitourinary   Musculoskeletal   Abdominal   Peds  Hematology negative hematology ROS (+)   Anesthesia Other Findings   Reproductive/Obstetrics negative OB ROS                             Anesthesia Physical Anesthesia Plan  ASA: II  Anesthesia Plan: General   Post-op Pain Management:    Induction: Intravenous  PONV Risk Score and Plan: 2 and Ondansetron, Dexamethasone and Treatment may vary due to age or medical condition  Airway Management Planned: LMA  Additional Equipment:   Intra-op Plan:   Post-operative Plan:   Informed Consent: I have reviewed the patients History and Physical, chart, labs and discussed the procedure including the risks, benefits and alternatives for the proposed anesthesia with the patient or authorized representative who has indicated his/her understanding and acceptance.   Dental Advisory Given  Plan Discussed with: CRNA, Anesthesiologist and Surgeon  Anesthesia Plan Comments: ( )        Anesthesia Quick Evaluation

## 2018-04-24 ENCOUNTER — Encounter (HOSPITAL_COMMUNITY): Payer: Self-pay

## 2018-04-24 ENCOUNTER — Ambulatory Visit (HOSPITAL_COMMUNITY): Payer: BLUE CROSS/BLUE SHIELD | Admitting: Anesthesiology

## 2018-04-24 ENCOUNTER — Other Ambulatory Visit: Payer: Self-pay

## 2018-04-24 ENCOUNTER — Encounter (HOSPITAL_COMMUNITY)
Admission: RE | Disposition: A | Discharge status: Home / Self Care | Payer: Self-pay | Source: Ambulatory Visit | Attending: Obstetrics & Gynecology

## 2018-04-24 ENCOUNTER — Ambulatory Visit (HOSPITAL_COMMUNITY)
Admission: RE | Admit: 2018-04-24 | Discharge: 2018-04-24 | Disposition: A | Discharge status: Home / Self Care | Payer: BLUE CROSS/BLUE SHIELD | Source: Ambulatory Visit | Attending: Obstetrics & Gynecology | Admitting: Obstetrics & Gynecology

## 2018-04-24 DIAGNOSIS — F419 Anxiety disorder, unspecified: Secondary | ICD-10-CM | POA: Insufficient documentation

## 2018-04-24 DIAGNOSIS — Z87891 Personal history of nicotine dependence: Secondary | ICD-10-CM | POA: Diagnosis not present

## 2018-04-24 DIAGNOSIS — Z7951 Long term (current) use of inhaled steroids: Secondary | ICD-10-CM | POA: Insufficient documentation

## 2018-04-24 DIAGNOSIS — J45909 Unspecified asthma, uncomplicated: Secondary | ICD-10-CM | POA: Insufficient documentation

## 2018-04-24 DIAGNOSIS — F329 Major depressive disorder, single episode, unspecified: Secondary | ICD-10-CM | POA: Insufficient documentation

## 2018-04-24 DIAGNOSIS — Z79899 Other long term (current) drug therapy: Secondary | ICD-10-CM | POA: Insufficient documentation

## 2018-04-24 DIAGNOSIS — Z809 Family history of malignant neoplasm, unspecified: Secondary | ICD-10-CM | POA: Insufficient documentation

## 2018-04-24 DIAGNOSIS — O021 Missed abortion: Secondary | ICD-10-CM | POA: Diagnosis not present

## 2018-04-24 DIAGNOSIS — Z3A01 Less than 8 weeks gestation of pregnancy: Secondary | ICD-10-CM | POA: Diagnosis not present

## 2018-04-24 DIAGNOSIS — Z8249 Family history of ischemic heart disease and other diseases of the circulatory system: Secondary | ICD-10-CM | POA: Diagnosis not present

## 2018-04-24 HISTORY — PX: DILATION AND EVACUATION: SHX1459

## 2018-04-24 HISTORY — DX: Anxiety disorder, unspecified: F41.9

## 2018-04-24 LAB — CBC
HEMATOCRIT: 41.7 % (ref 36.0–46.0)
Hemoglobin: 14.1 g/dL (ref 12.0–15.0)
MCH: 25.8 pg — AB (ref 26.0–34.0)
MCHC: 33.8 g/dL (ref 30.0–36.0)
MCV: 76.4 fL — AB (ref 78.0–100.0)
Platelets: 226 10*3/uL (ref 150–400)
RBC: 5.46 MIL/uL — AB (ref 3.87–5.11)
RDW: 14 % (ref 11.5–15.5)
WBC: 11.3 10*3/uL — AB (ref 4.0–10.5)

## 2018-04-24 SURGERY — DILATION AND EVACUATION, UTERUS
Anesthesia: General | Site: Vagina

## 2018-04-24 MED ORDER — DEXAMETHASONE SODIUM PHOSPHATE 4 MG/ML IJ SOLN
INTRAMUSCULAR | Status: DC | PRN
  Administered 2018-04-24: 5 mg via INTRAVENOUS

## 2018-04-24 MED ORDER — MIDAZOLAM HCL 2 MG/2ML IJ SOLN
INTRAMUSCULAR | Status: AC
  Filled 2018-04-24: qty 2

## 2018-04-24 MED ORDER — DEXAMETHASONE SODIUM PHOSPHATE 10 MG/ML IJ SOLN
INTRAMUSCULAR | Status: AC
  Filled 2018-04-24: qty 1

## 2018-04-24 MED ORDER — FENTANYL CITRATE (PF) 250 MCG/5ML IJ SOLN
INTRAMUSCULAR | Status: DC | PRN
  Administered 2018-04-24: 100 ug via INTRAVENOUS

## 2018-04-24 MED ORDER — GLYCOPYRROLATE 0.2 MG/ML IJ SOLN
INTRAMUSCULAR | Status: DC | PRN
  Administered 2018-04-24: 0.1 mg via INTRAVENOUS

## 2018-04-24 MED ORDER — FENTANYL CITRATE (PF) 100 MCG/2ML IJ SOLN: 25.0000 ug | INTRAMUSCULAR | Status: DC | PRN

## 2018-04-24 MED ORDER — SODIUM CHLORIDE 0.9 % IV SOLN
INTRAVENOUS | Status: DC | PRN
  Administered 2018-04-24: 100 mg via INTRAVENOUS

## 2018-04-24 MED ORDER — SCOPOLAMINE 1 MG/3DAYS TD PT72
1.0000 | MEDICATED_PATCH | Freq: Once | TRANSDERMAL | Status: DC
  Administered 2018-04-24: 1.5 mg via TRANSDERMAL

## 2018-04-24 MED ORDER — PROPOFOL 10 MG/ML IV BOLUS
INTRAVENOUS | Status: DC | PRN
  Administered 2018-04-24: 150 mg via INTRAVENOUS

## 2018-04-24 MED ORDER — GLYCOPYRROLATE 0.2 MG/ML IJ SOLN
INTRAMUSCULAR | Status: AC
  Filled 2018-04-24: qty 1

## 2018-04-24 MED ORDER — FENTANYL CITRATE (PF) 100 MCG/2ML IJ SOLN
INTRAMUSCULAR | Status: AC
  Filled 2018-04-24: qty 2

## 2018-04-24 MED ORDER — SODIUM CHLORIDE 0.9 % IV SOLN
100.0000 mg | Freq: Once | INTRAVENOUS | Status: AC
  Administered 2018-04-24: 100 mg via INTRAVENOUS
  Filled 2018-04-24: qty 100

## 2018-04-24 MED ORDER — OXYCODONE-ACETAMINOPHEN 5-325 MG PO TABS: 1.0000 | ORAL_TABLET | ORAL | 0 refills | Status: AC | PRN

## 2018-04-24 MED ORDER — BUPIVACAINE-EPINEPHRINE 0.5% -1:200000 IJ SOLN
INTRAMUSCULAR | Status: DC | PRN
  Administered 2018-04-24: 10 mL

## 2018-04-24 MED ORDER — LIDOCAINE HCL URETHRAL/MUCOSAL 2 % EX GEL
CUTANEOUS | Status: AC
  Filled 2018-04-24: qty 5

## 2018-04-24 MED ORDER — ONDANSETRON HCL 4 MG/2ML IJ SOLN
INTRAMUSCULAR | Status: AC
  Filled 2018-04-24: qty 2

## 2018-04-24 MED ORDER — ACETAMINOPHEN 160 MG/5ML PO SOLN: 325.0000 mg | ORAL | Status: DC | PRN

## 2018-04-24 MED ORDER — PROPOFOL 10 MG/ML IV BOLUS
INTRAVENOUS | Status: AC
  Filled 2018-04-24: qty 40

## 2018-04-24 MED ORDER — ONDANSETRON HCL 4 MG/2ML IJ SOLN: 4.0000 mg | Freq: Once | INTRAMUSCULAR | Status: DC | PRN

## 2018-04-24 MED ORDER — LACTATED RINGERS IV SOLN
INTRAVENOUS | Status: DC
  Administered 2018-04-24: 125 mL/h via INTRAVENOUS

## 2018-04-24 MED ORDER — OXYCODONE HCL 5 MG PO TABS: 5.0000 mg | ORAL_TABLET | Freq: Once | ORAL | Status: DC | PRN

## 2018-04-24 MED ORDER — SCOPOLAMINE 1 MG/3DAYS TD PT72
MEDICATED_PATCH | TRANSDERMAL | Status: AC
  Administered 2018-04-24: 1.5 mg via TRANSDERMAL
  Filled 2018-04-24: qty 1

## 2018-04-24 MED ORDER — ONDANSETRON HCL 4 MG/2ML IJ SOLN
INTRAMUSCULAR | Status: DC | PRN
  Administered 2018-04-24: 4 mg via INTRAVENOUS

## 2018-04-24 MED ORDER — LIDOCAINE HCL (CARDIAC) PF 100 MG/5ML IV SOSY
PREFILLED_SYRINGE | INTRAVENOUS | Status: DC | PRN
  Administered 2018-04-24: 100 mg via INTRAVENOUS

## 2018-04-24 MED ORDER — LIDOCAINE HCL (CARDIAC) PF 100 MG/5ML IV SOSY
PREFILLED_SYRINGE | INTRAVENOUS | Status: AC
  Filled 2018-04-24: qty 5

## 2018-04-24 MED ORDER — IBUPROFEN 600 MG PO TABS: 600.0000 mg | ORAL_TABLET | Freq: Four times a day (QID) | ORAL | 0 refills | Status: DC | PRN

## 2018-04-24 MED ORDER — KETOROLAC TROMETHAMINE 30 MG/ML IJ SOLN
INTRAMUSCULAR | Status: DC | PRN
  Administered 2018-04-24: 30 mg via INTRAVENOUS

## 2018-04-24 MED ORDER — MIDAZOLAM HCL 2 MG/2ML IJ SOLN
INTRAMUSCULAR | Status: DC | PRN
  Administered 2018-04-24: 2 mg via INTRAVENOUS

## 2018-04-24 MED ORDER — KETOROLAC TROMETHAMINE 30 MG/ML IJ SOLN
INTRAMUSCULAR | Status: AC
  Filled 2018-04-24: qty 1

## 2018-04-24 MED ORDER — MEPERIDINE HCL 25 MG/ML IJ SOLN: 6.2500 mg | INTRAMUSCULAR | Status: DC | PRN

## 2018-04-24 MED ORDER — ACETAMINOPHEN 325 MG PO TABS: 325.0000 mg | ORAL_TABLET | ORAL | Status: DC | PRN

## 2018-04-24 MED ORDER — OXYCODONE HCL 5 MG/5ML PO SOLN: 5.0000 mg | Freq: Once | ORAL | Status: DC | PRN

## 2018-04-24 MED ORDER — BUPIVACAINE-EPINEPHRINE (PF) 0.5% -1:200000 IJ SOLN
INTRAMUSCULAR | Status: AC
  Filled 2018-04-24: qty 30

## 2018-04-24 SURGICAL SUPPLY — 17 items
CATH ROBINSON RED A/P 16FR (CATHETERS) ×2 IMPLANT
DECANTER SPIKE VIAL GLASS SM (MISCELLANEOUS) ×2 IMPLANT
GLOVE BIO SURGEON STRL SZ 6.5 (GLOVE) ×2 IMPLANT
GLOVE BIOGEL PI IND STRL 7.0 (GLOVE) ×2 IMPLANT
GLOVE BIOGEL PI INDICATOR 7.0 (GLOVE) ×2
GOWN STRL REUS W/TWL LRG LVL3 (GOWN DISPOSABLE) ×4 IMPLANT
KIT BERKELEY 1ST TRIMESTER 3/8 (MISCELLANEOUS) ×2 IMPLANT
NS IRRIG 1000ML POUR BTL (IV SOLUTION) ×2 IMPLANT
PACK VAGINAL MINOR WOMEN LF (CUSTOM PROCEDURE TRAY) ×2 IMPLANT
PAD OB MATERNITY 4.3X12.25 (PERSONAL CARE ITEMS) ×2 IMPLANT
PAD PREP 24X48 CUFFED NSTRL (MISCELLANEOUS) ×2 IMPLANT
SET BERKELEY SUCTION TUBING (SUCTIONS) ×2 IMPLANT
TOWEL OR 17X24 6PK STRL BLUE (TOWEL DISPOSABLE) ×4 IMPLANT
VACURETTE 10 RIGID CVD (CANNULA) IMPLANT
VACURETTE 7MM CVD STRL WRAP (CANNULA) IMPLANT
VACURETTE 8 RIGID CVD (CANNULA) ×2 IMPLANT
VACURETTE 9 RIGID CVD (CANNULA) IMPLANT

## 2018-04-24 NOTE — Op Note (Signed)
Patient: Sally Cardenas, Sally Cardenas  Date of birth: [birth year]  Date of procedure: 04/24/2018  Preop diagnosis: Missed abortion at 7 weeks 6 days EGA.   Postop diagnosis: Same as above.    Procedure:  1. Suction dilation and curretage  Surgeon: Dr. Hoover Browns.  Anesthesia: General LMA  Complications: None  Input: 700cc LR  Output: EBL: 100cc including products of conception.    Urine: 25cc straight catheterization.   Findings: Uterus 8 week sized. No palpable adnexal masses bilaterally. Cervix closed with small blood coming through the os.  Indications: 27 year-old G1P0 with a missed abortion at 7 weeks 6 days EGA here for a suction dilation and curretage.  A recent pelvic ultrasound showed a fetal pole without a fetal heart beat at 7 weeks 6 days EGA.  She was consented for the procedure after explaining the risks, benefits and alternatives of the procedure including risks of bleeding, infection, damage to organs, uterine scarring and Asherman's syndrome.  She was a known blood group O pos.      Procedure: She was taken to the operating room anesthesia was administered without difficulty. Doxycyline IV was given preoperatively.  She was placed in the dorsal lithotomy position and prepped and draped in the usual sterile fashion.   Straight catheterization was performed.  Graves speculum was placed in and cervix grasped with ring forcep anteriorly.  Marcaine with epinephrine was given as a paracervical block, total 10 cc.  Cervix was dilated to the # 21 pratt.  # 8 suction currett advanced through cervix to fundus and electric suction initiated to green zone, 600 mm Hg.  Three passes were made.  Gentle sharp currettage was done and uterine walls noted to be gritty. Suction was done one more time.  Excellent hemostasis was noted.  The instruments were then removed and the patient was then awoken from anesthesia she was cleaned and taken to recovery room in stable condition.  Specimen: Products of  conception.   Dr. Sallye Ober, MD.

## 2018-04-24 NOTE — Anesthesia Postprocedure Evaluation (Signed)
Anesthesia Post Note  Patient: Sally Cardenas  Procedure(s) Performed: DILATATION AND EVACUATION (N/A Vagina )     Patient location during evaluation: PACU Anesthesia Type: General Level of consciousness: awake and alert Pain management: pain level controlled Vital Signs Assessment: post-procedure vital signs reviewed and stable Respiratory status: spontaneous breathing, nonlabored ventilation, respiratory function stable and patient connected to nasal cannula oxygen Cardiovascular status: blood pressure returned to baseline and stable Postop Assessment: no apparent nausea or vomiting Anesthetic complications: no    Last Vitals:  Vitals:   04/24/18 0951 04/24/18 1000  BP: 121/72 108/71  Pulse: 87 66  Resp: 14 11  Temp: 36.8 C   SpO2: 100% 100%    Last Pain:  Vitals:   04/24/18 1000  TempSrc:   PainSc: 0-No pain   Pain Goal: Patients Stated Pain Goal: 5 (04/24/18 1000)               Anhar Mcdermott

## 2018-04-24 NOTE — H&P (Signed)
Sally Cardenas is an 27 y.o. female with missed abortion at 7 weeks 6 days EGA here for a suction dilation and currettage.   Pertinent Gynecological History: Bleeding: With vaginal bleeding.  Contraception: none DES exposure: unknown Blood transfusions: none Sexually transmitted diseases: no past history Previous GYN Procedures: LEEP  Last pap: normal   Menstrual History: Patient's last menstrual period was 02/03/2018 (exact date).    Past Medical History:  Diagnosis Date  . Allergy   . Anxiety   . Asthma   . Depression     Past Surgical History:  Procedure Laterality Date  . CERVICAL BIOPSY  W/ LOOP ELECTRODE EXCISION      Family History  Problem Relation Age of Onset  . Hyperlipidemia Mother   . Hypertension Mother   . Cancer Father     Social History:  reports that she quit smoking about 2 years ago. She has never used smokeless tobacco. She reports that she does not drink alcohol or use drugs.  Allergies: No Known Allergies  Medications Prior to Admission  Medication Sig Dispense Refill Last Dose  . acetaminophen (TYLENOL) 500 MG tablet Take 1,000 mg by mouth daily as needed for moderate pain or headache.   Past Week at Unknown time  . albuterol (PROVENTIL HFA;VENTOLIN HFA) 108 (90 Base) MCG/ACT inhaler Inhale 2 puffs into the lungs every 6 (six) hours as needed for wheezing or shortness of breath.    04/23/2018 at Unknown time  . cetirizine (ZYRTEC) 10 MG tablet Take 10 mg by mouth daily as needed for allergies.   Past Week at Unknown time  . escitalopram (LEXAPRO) 10 MG tablet Take 10 mg by mouth daily.   04/24/2018 at 0630  . fluticasone (FLONASE) 50 MCG/ACT nasal spray Place 1 spray into both nostrils daily as needed for allergies or rhinitis.   Past Month at Unknown time  . montelukast (SINGULAIR) 10 MG tablet Take 10 mg by mouth daily.    04/24/2018 at 0630  . Prenatal Vit-Fe Fumarate-FA (PRENATAL MULTIVITAMIN) TABS tablet Take 1 tablet by mouth daily.    Past  Week at Unknown time  . triamcinolone ointment (KENALOG) 0.1 % Apply 1 application topically 2 (two) times daily as needed (eczema).    Past Week at Unknown time  . valACYclovir (VALTREX) 500 MG tablet Take 500 mg by mouth 2 (two) times daily as needed (fever blisters).   1 Past Month at Unknown time    ROS  Blood pressure 107/86, pulse 91, temperature 98.4 F (36.9 C), temperature source Oral, resp. rate 16, height 5' (1.524 m), weight 63 kg, last menstrual period 02/03/2018, SpO2 99 %. Physical Exam  Results for orders placed or performed during the hospital encounter of 04/24/18 (from the past 24 hour(s))  CBC     Status: Abnormal   Collection Time: 04/24/18  7:43 AM  Result Value Ref Range   WBC 11.3 (H) 4.0 - 10.5 K/uL   RBC 5.46 (H) 3.87 - 5.11 MIL/uL   Hemoglobin 14.1 12.0 - 15.0 g/dL   HCT 16.1 09.6 - 04.5 %   MCV 76.4 (L) 78.0 - 100.0 fL   MCH 25.8 (L) 26.0 - 34.0 pg   MCHC 33.8 30.0 - 36.0 g/dL   RDW 40.9 81.1 - 91.4 %   Platelets 226 150 - 400 K/uL   Blood group O positive.   Result Date: 04/21/2018 CLINICAL DATA:  Pregnant, intermittent bleeding EXAM: OBSTETRIC <14 WK [place] AND TRANSVAGINAL OB US TECHNIQUE: Both transabdominal and  transvaginal ultrasound examinations were performed for complete evaluation of the gestation as well as the maternal uterus, adnexal regions, and pelvic cul-de-sac. Transvaginal technique was performed to assess early pregnancy. COMPARISON:  None. FINDINGS: Intrauterine gestational sac: Single Yolk sac:  Not Visualized. Embryo:  Visualized. Cardiac Activity: Not Visualized. Heart Rate: 0  bpm CRL:  15.1 mm   7 w   6 d Subchorionic hemorrhage:  Small subchronic hemorrhage. Maternal uterus/adnexae: Bilateral ovaries are within normal limits. No free fluid. IMPRESSION: Single intrauterine gestation, measuring 7 weeks 6 days by crown-rump length, without cardiac activity. Findings meet definitive criteria for failed pregnancy. This follows SRU consensus  guidelines: Diagnostic Criteria for Nonviable Pregnancy Early in the First Trimester. [facility] [phone]. These results were called by telephone at the time of interpretation on 04/21/2018 at 8:32 pm to [facility], provider in [facility], who verbally acknowledged these results. Electronically Signed   By: Charline Bills M.D.   On: 04/21/2018 20:32   No results found.  Assessment/Plan: 27 y/o with missed abortion at 7 weeks 6 days EGA here or suction dilation and curretage.   This procedure has been fully reviewed with the patient and written informed consent has been obtained.  We discussed risks of bleeding, infection and damage to organs.  All her questions were answered.      Konrad Felix, MD.  04/24/2018, 9:04 AM

## 2018-04-24 NOTE — Anesthesia Procedure Notes (Signed)
Procedure Name: LMA Insertion Date/Time: 04/24/2018 9:25 AM Performed by: Graciela Husbands, CRNA Pre-anesthesia Checklist: Patient identified, Patient being monitored, Emergency Drugs available, Timeout performed and Suction available Patient Re-evaluated:Patient Re-evaluated prior to induction Oxygen Delivery Method: Circle System Utilized Preoxygenation: Pre-oxygenation with 100% oxygen Induction Type: IV induction Ventilation: Mask ventilation without difficulty LMA: LMA inserted LMA Size: 4.0 Number of attempts: 1 Placement Confirmation: positive ETCO2 and breath sounds checked- equal and bilateral Tube secured with: Tape Dental Injury: Teeth and Oropharynx as per pre-operative assessment

## 2018-04-24 NOTE — Discharge Instructions (Addendum)
°  Post Anesthesia Home Care Instructions  No ibuprofen products until: 3:30 pm today  Activity: Get plenty of rest for the remainder of the day. A responsible individual must stay with you for 24 hours following the procedure.  For the next 24 hours, DO NOT: -Drive a car -Advertising copywriter -Drink alcoholic beverages -Take any medication unless instructed by your physician -Make any legal decisions or sign important papers.  Meals: Start with liquid foods such as gelatin or soup. Progress to regular foods as tolerated. Avoid greasy, spicy, heavy foods. If nausea and/or vomiting occur, drink only clear liquids until the nausea and/or vomiting subsides. Call your physician if vomiting continues.  Special Instructions/Symptoms: Your throat may feel dry or sore from the anesthesia or the breathing tube placed in your throat during surgery. If this causes discomfort, gargle with warm salt water. The discomfort should disappear within 24 hours.  If you had a scopolamine patch placed behind your ear for the management of post- operative nausea and/or vomiting:  1. The medication in the patch is effective for 72 hours, after which it should be removed.  Wrap patch in a tissue and discard in the trash. Wash hands thoroughly with soap and water. 2. You may remove the patch earlier than 72 hours if you experience unpleasant side effects which may include dry mouth, dizziness or visual disturbances. 3. Avoid touching the patch. Wash your hands with soap and water after contact with the patch.

## 2018-04-24 NOTE — Transfer of Care (Signed)
Immediate Anesthesia Transfer of Care Note  Patient: Sally Cardenas  Procedure(s) Performed: DILATATION AND EVACUATION (N/A Vagina )  Patient Location: PACU  Anesthesia Type:General  Level of Consciousness: awake, alert  and oriented  Airway & Oxygen Therapy: Patient Spontanous Breathing and Patient connected to nasal cannula oxygen  Post-op Assessment: Report given to RN and Post -op Vital signs reviewed and stable  Post vital signs: Reviewed and stable  Last Vitals:  Vitals Value Taken Time  BP 121/72 04/24/2018  9:51 AM  Temp    Pulse 78 04/24/2018  9:52 AM  Resp 14 04/24/2018  9:52 AM  SpO2 100 % 04/24/2018  9:52 AM  Vitals shown include unvalidated device data.  Last Pain:  Vitals:   04/24/18 0723  TempSrc: Oral  PainSc: 0-No pain      Patients Stated Pain Goal: 5 (04/24/18 0723)  Complications: No apparent anesthesia complications

## 2018-04-25 ENCOUNTER — Encounter (HOSPITAL_COMMUNITY): Payer: Self-pay | Admitting: Obstetrics & Gynecology

## 2019-02-04 ENCOUNTER — Encounter (HOSPITAL_COMMUNITY): Payer: Self-pay

## 2020-01-04 ENCOUNTER — Emergency Department (HOSPITAL_COMMUNITY): Payer: 59

## 2020-01-04 ENCOUNTER — Other Ambulatory Visit: Payer: Self-pay

## 2020-01-04 ENCOUNTER — Encounter (HOSPITAL_COMMUNITY): Payer: Self-pay

## 2020-01-04 ENCOUNTER — Emergency Department (HOSPITAL_COMMUNITY)
Admission: EM | Admit: 2020-01-04 | Discharge: 2020-01-04 | Disposition: A | Discharge status: Home / Self Care | Payer: 59 | Attending: Emergency Medicine | Admitting: Emergency Medicine

## 2020-01-04 DIAGNOSIS — S0990XA Unspecified injury of head, initial encounter: Secondary | ICD-10-CM | POA: Diagnosis not present

## 2020-01-04 DIAGNOSIS — Y929 Unspecified place or not applicable: Secondary | ICD-10-CM | POA: Insufficient documentation

## 2020-01-04 DIAGNOSIS — Z87891 Personal history of nicotine dependence: Secondary | ICD-10-CM | POA: Diagnosis not present

## 2020-01-04 DIAGNOSIS — J45909 Unspecified asthma, uncomplicated: Secondary | ICD-10-CM | POA: Diagnosis not present

## 2020-01-04 DIAGNOSIS — Y939 Activity, unspecified: Secondary | ICD-10-CM | POA: Diagnosis not present

## 2020-01-04 DIAGNOSIS — W11XXXA Fall on and from ladder, initial encounter: Secondary | ICD-10-CM | POA: Diagnosis not present

## 2020-01-04 DIAGNOSIS — S5002XA Contusion of left elbow, initial encounter: Secondary | ICD-10-CM

## 2020-01-04 DIAGNOSIS — Y999 Unspecified external cause status: Secondary | ICD-10-CM | POA: Diagnosis not present

## 2020-01-04 DIAGNOSIS — M79602 Pain in left arm: Secondary | ICD-10-CM

## 2020-01-04 DIAGNOSIS — S29019A Strain of muscle and tendon of unspecified wall of thorax, initial encounter: Secondary | ICD-10-CM | POA: Insufficient documentation

## 2020-01-04 DIAGNOSIS — W19XXXA Unspecified fall, initial encounter: Secondary | ICD-10-CM

## 2020-01-04 LAB — BASIC METABOLIC PANEL
Anion gap: 14 (ref 5–15)
BUN: 7 mg/dL (ref 6–20)
CO2: 22 mmol/L (ref 22–32)
Calcium: 9.2 mg/dL (ref 8.9–10.3)
Chloride: 102 mmol/L (ref 98–111)
Creatinine, Ser: 0.55 mg/dL (ref 0.44–1.00)
GFR calc Af Amer: >60 mL/min (ref >60)
GFR calc non Af Amer: >60 mL/min (ref >60)
Glucose, Bld: 128 mg/dL — ABNORMAL HIGH (ref 70–99)
Potassium: 3.6 mmol/L (ref 3.5–5.1)
Sodium: 138 mmol/L (ref 135–145)

## 2020-01-04 LAB — CBC
HCT: 40.6 % (ref 36.0–46.0)
Hemoglobin: 13.6 g/dL (ref 12.0–15.0)
MCH: 26 pg (ref 26.0–34.0)
MCHC: 33.5 g/dL (ref 30.0–36.0)
MCV: 77.5 fL — ABNORMAL LOW (ref 80.0–100.0)
Platelets: 292 10*3/uL (ref 150–400)
RBC: 5.24 MIL/uL — ABNORMAL HIGH (ref 3.87–5.11)
RDW: 14.1 % (ref 11.5–15.5)
WBC: 12.6 10*3/uL — ABNORMAL HIGH (ref 4.0–10.5)
nRBC: 0 % (ref 0.0–0.2)

## 2020-01-04 LAB — POC URINE PREG, ED: Preg Test, Ur: NEGATIVE

## 2020-01-04 MED ORDER — IBUPROFEN 800 MG PO TABS: 800.0000 mg | ORAL_TABLET | Freq: Three times a day (TID) | ORAL | 0 refills | Status: DC | PRN

## 2020-01-04 MED ORDER — METHOCARBAMOL 500 MG PO TABS: 500.0000 mg | ORAL_TABLET | Freq: Three times a day (TID) | ORAL | 0 refills | Status: DC | PRN

## 2020-01-04 MED ORDER — OXYCODONE-ACETAMINOPHEN 5-325 MG PO TABS
1.0000 | ORAL_TABLET | Freq: Once | ORAL | Status: AC
  Administered 2020-01-04: 1 via ORAL
  Filled 2020-01-04: qty 1

## 2020-01-04 NOTE — ED Notes (Signed)
Patient verbalizes understanding of discharge instructions. Opportunity for questioning and answers were provided. Armband removed by staff, pt discharged from ED.  

## 2020-01-04 NOTE — Discharge Instructions (Signed)
You were seen in the emergency department today after fall off of a ladder.  Your CT scans and x-rays did not show any broken bones or bleeding.  You do have a collection of blood or hematoma on your left elbow.  Please apply ice and keep this area elevated above the level of your heart when at rest.  Continue to move it frequently and use it as much as you are able.  Please follow closely with your primary care doctor.  Please also continue your antibiotics for strep throat and return if those symptoms become new or suddenly severe.

## 2020-01-04 NOTE — ED Provider Notes (Signed)
Emergency Department Provider Note   I have reviewed the triage vital signs and the nursing notes.   HISTORY  Chief Complaint Fall   HPI Saraiyah Hemminger is a 29 y.o. female with PMH of asthma presents to the emergency department for evaluation after fall last night.  Patient has been drinking and fell off of an approximately 6 foot ladder onto the driveway.  She was working with a Psychologist, occupational when she fell backwards.  She denies loss of consciousness.  She is having pain mainly in the left arm as well as headache.  She has had some dizzy type feeling since the fall.  She is no longer feeling intoxicated.  She denies mid or lower back pain.  No weakness or numbness.  No abdominal discomfort.  Denies vision change.  Pain in the elbow is worse with movement or touching the area.  She has noticed some bruising and swelling in the elbow.   Past Medical History:  Diagnosis Date  . Allergy   . Anxiety   . Asthma   . Depression     Patient Active Problem List   Diagnosis Date Noted  . Vaginal bleeding 09/26/2016    Past Surgical History:  Procedure Laterality Date  . CERVICAL BIOPSY  W/ LOOP ELECTRODE EXCISION    . DILATION AND EVACUATION N/A 04/24/2018   Procedure: DILATATION AND EVACUATION;  Surgeon: Hoover Browns, MD;  Location: [facility];  Service: Gynecology;  Laterality: N/A;    Allergies Patient has no known allergies.  Family History  Problem Relation Age of Onset  . Hyperlipidemia Mother   . Hypertension Mother   . Cancer Father     Social History Social History   Tobacco Use  . Smoking status: Former Smoker    Quit date: 07/10/2015    Years since quitting: 4.4  . Smokeless tobacco: Never Used  Vaping Use  . Vaping Use: Former  Substance Use Topics  . Alcohol use: No    Alcohol/week: 0.0 standard drinks  . Drug use: No    Review of Systems  Constitutional: No fever/chills Eyes: No visual changes. ENT: No sore throat. Cardiovascular: Denies chest  pain. Respiratory: Denies shortness of breath. Gastrointestinal: No abdominal pain.  No nausea, no vomiting.  No diarrhea.  No constipation. Genitourinary: Negative for dysuria. Musculoskeletal: Negative for back pain. Positive left elbow pain and swelling.  Skin: Negative for rash. Neurological: Negative for focal weakness or numbness. Positive HA.   10-point ROS otherwise negative.  ____________________________________________   PHYSICAL EXAM:  VITAL SIGNS: ED Triage Vitals  Enc Vitals Group     BP 01/04/20 1406 105/80     Pulse Rate 01/04/20 1406 (!) 112     Resp 01/04/20 1406 15     Temp 01/04/20 1406 100.2 F (37.9 C)     Temp Source 01/04/20 1406 Oral     SpO2 01/04/20 1406 97 %   Constitutional: Alert and oriented. Well appearing and in no acute distress. Eyes: Conjunctivae are normal. PERRL.  Head: Atraumatic. Nose: No congestion/rhinnorhea. Mouth/Throat: Mucous membranes are moist.  Neck: No stridor. No cervical spine tenderness to palpation. Cardiovascular: Tachycardia. Good peripheral circulation. Grossly normal heart sounds.   Respiratory: Normal respiratory effort.  No retractions. Lungs CTAB. Gastrointestinal: Soft and nontender. No distention.  Musculoskeletal: No lower extremity tenderness nor edema. No gross deformities of extremities.  Patient with point tenderness in the area without step-off or deformity.  No midline lumbar tenderness.  Swelling over the left elbow  with bruising.  Range of motion normal in the elbow, wrist, shoulders bilaterally.  Neurologic:  Normal speech and language. No gross focal neurologic deficits are appreciated.  Skin:  Skin is warm, dry and intact. No rash noted.  ____________________________________________   LABS (all labs ordered are listed, but only abnormal results are displayed)  Labs Reviewed  CBC - Abnormal; Notable for the following components:      Result Value   WBC 12.6 (*)    RBC 5.24 (*)    MCV 77.5 (*)     All other components within normal limits  BASIC METABOLIC PANEL - Abnormal; Notable for the following components:   Glucose, Bld 128 (*)    All other components within normal limits  POC URINE PREG, ED   ____________________________________________  RADIOLOGY  DG Elbow Complete Left  Result Date: 01/04/2020 CLINICAL DATA:  Fall yesterday with left elbow pain, initial encounter EXAM: LEFT ELBOW - COMPLETE 3+ VIEW COMPARISON:  None. FINDINGS: Soft tissue swelling is noted consistent with the recent injury. No joint effusion is noted. No acute fracture or dislocation is seen. IMPRESSION: Soft tissue swelling consistent with the recent injury. No bony abnormality is noted. Electronically Signed   By: Alcide Clever M.D.   On: 01/04/2020 15:52   CT HEAD WO CONTRAST  Result Date: 01/04/2020 CLINICAL DATA:  Head trauma, altered mental status, fall from ladder last night. Dizziness and headache. EXAM: CT HEAD WITHOUT CONTRAST CT CERVICAL SPINE WITHOUT CONTRAST TECHNIQUE: Multidetector CT imaging of the head and cervical spine was performed following the standard protocol without intravenous contrast. Multiplanar CT image reconstructions of the cervical spine were also generated. COMPARISON:  None. FINDINGS: CT HEAD FINDINGS Brain: Earrings create artifact. No acute intracranial hemorrhage, mass lesion, acute infarction, midline shift, herniation, hydrocephalus, or extra-axial fluid collection. No focal mass effect or edema. Cisterns are patent. No cerebellar abnormality. Vascular: No hyperdense vessel or unexpected calcification. Skull: Normal. Negative for fracture or focal lesion. Sinuses/Orbits: No acute finding. Other: None. CT CERVICAL SPINE FINDINGS Alignment: Loss of normal lordosis. Slight kyphotic curvature appearing positional. Facets are aligned. No subluxation or dislocation. Skull base and vertebrae: No acute fracture. No primary bone lesion or focal pathologic process. Soft tissues and spinal  canal: No prevertebral fluid or swelling. No visible canal hematoma. Disc levels:  Preserved vertebral body heights and disc spaces. Upper chest: Negative. Other: None. IMPRESSION: Normal head CT without contrast. No acute cervical spine fracture or malalignment. Mild cervical kyphotic curvature appearing positional. Electronically Signed   By: Judie Petit.  Shick M.D.   On: 01/04/2020 15:56   CT Cervical Spine Wo Contrast  Result Date: 01/04/2020 CLINICAL DATA:  Head trauma, altered mental status, fall from ladder last night. Dizziness and headache. EXAM: CT HEAD WITHOUT CONTRAST CT CERVICAL SPINE WITHOUT CONTRAST TECHNIQUE: Multidetector CT imaging of the head and cervical spine was performed following the standard protocol without intravenous contrast. Multiplanar CT image reconstructions of the cervical spine were also generated. COMPARISON:  None. FINDINGS: CT HEAD FINDINGS Brain: Earrings create artifact. No acute intracranial hemorrhage, mass lesion, acute infarction, midline shift, herniation, hydrocephalus, or extra-axial fluid collection. No focal mass effect or edema. Cisterns are patent. No cerebellar abnormality. Vascular: No hyperdense vessel or unexpected calcification. Skull: Normal. Negative for fracture or focal lesion. Sinuses/Orbits: No acute finding. Other: None. CT CERVICAL SPINE FINDINGS Alignment: Loss of normal lordosis. Slight kyphotic curvature appearing positional. Facets are aligned. No subluxation or dislocation. Skull base and vertebrae: No acute fracture. No primary  bone lesion or focal pathologic process. Soft tissues and spinal canal: No prevertebral fluid or swelling. No visible canal hematoma. Disc levels:  Preserved vertebral body heights and disc spaces. Upper chest: Negative. Other: None. IMPRESSION: Normal head CT without contrast. No acute cervical spine fracture or malalignment. Mild cervical kyphotic curvature appearing positional. Electronically Signed   By: Jerilynn Mages.  Shick M.D.   On:  01/04/2020 15:56   DG Shoulder Left  Result Date: 01/04/2020 CLINICAL DATA:  Fall yesterday with left shoulder and arm pain, initial encounter EXAM: LEFT SHOULDER - 2+ VIEW COMPARISON:  None. FINDINGS: There is no evidence of fracture or dislocation. There is no evidence of arthropathy or other focal bone abnormality. Soft tissues are unremarkable. IMPRESSION: No acute abnormality noted. Electronically Signed   By: Inez Catalina M.D.   On: 01/04/2020 15:51   DG Humerus Left  Result Date: 01/04/2020 CLINICAL DATA:  Recent fall with left arm pain, initial encounter EXAM: LEFT HUMERUS - 2+ VIEW COMPARISON:  None. FINDINGS: There is no evidence of fracture or other focal bone lesions. Soft tissues are unremarkable. IMPRESSION: No acute abnormality noted. Electronically Signed   By: Inez Catalina M.D.   On: 01/04/2020 15:52    ____________________________________________   PROCEDURES  Procedure(s) performed:   Procedures  None  ____________________________________________   INITIAL IMPRESSION / ASSESSMENT AND PLAN / ED COURSE  Pertinent labs & imaging results that were available during my care of the patient were reviewed by me and considered in my medical decision making (see chart for details).   Patient presents to the emergency department after fall last night from a 6 foot ladder.  She has mainly swelling over the left elbow without bony abnormality on x-ray.  Range of motion is full but is having some pain.  Patient arrives to the acute area with labs and imaging completed which I have reviewed.  I am adding a thoracic spine plain film with point tenderness in the T4 area.  No neuro deficits.  Will give single Percocet here for pain control while pending additional imaging.  Patient is not clinically intoxicated.   Patient does have borderline fever here.  She tells me that she she is currently being treated for strep throat with antibiotics.  She is clinically feeling much better. No  cough or URI/COVID symptoms.   Plan for discharge home with Robaxin. Discussed drowsy side effects of this med and need for close PCP follow up.   I reviewed all nursing notes, vitals, pertinent old records, EKGs, labs, imaging (as available). ____________________________________________  FINAL CLINICAL IMPRESSION(S) / ED DIAGNOSES  Final diagnoses:  Fall, initial encounter  Injury of head, initial encounter  Left arm pain  Contusion of left elbow, initial encounter  Thoracic myofascial strain, initial encounter     MEDICATIONS GIVEN DURING THIS VISIT:  Medications  oxyCODONE-acetaminophen (PERCOCET/ROXICET) 5-325 MG per tablet 1 tablet (1 tablet Oral Given 01/04/20 1735)     NEW OUTPATIENT MEDICATIONS STARTED DURING THIS VISIT:  Discharge Medication List as of 01/04/2020  5:23 PM    START taking these medications   Details  methocarbamol (ROBAXIN) 500 MG tablet Take 1 tablet (500 mg total) by mouth every 8 (eight) hours as needed for muscle spasms., Starting Sat 01/04/2020, Normal        Note:  This document was prepared using Dragon voice recognition software and may include unintentional dictation errors.  Nanda Quinton, MD, [facility]    Clarinda Obi, [facility], MD 01/07/20 [phone]

## 2020-01-04 NOTE — ED Triage Notes (Signed)
Pt arrives to ED w/ c/o fall last night. Pt states she fell from a ladder 6 ft and hit the back of her head and L arm. Pt c/o 7/10 pain. Edema and bruising noted to L elbow, pt endorses dizziness since fall. Pt also endorses ETOH use today.

## 2021-02-11 ENCOUNTER — Encounter (HOSPITAL_COMMUNITY): Payer: Self-pay | Admitting: Licensed Clinical Social Worker

## 2021-02-11 ENCOUNTER — Ambulatory Visit (INDEPENDENT_AMBULATORY_CARE_PROVIDER_SITE_OTHER): Payer: Medicaid Other | Admitting: Licensed Clinical Social Worker

## 2021-02-11 DIAGNOSIS — F1014 Alcohol abuse with alcohol-induced mood disorder: Secondary | ICD-10-CM | POA: Diagnosis not present
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
## 2021-03-08 ENCOUNTER — Ambulatory Visit (INDEPENDENT_AMBULATORY_CARE_PROVIDER_SITE_OTHER): Payer: Medicaid Other | Admitting: Licensed Clinical Social Worker

## 2021-03-08 ENCOUNTER — Other Ambulatory Visit: Payer: Self-pay

## 2021-03-08 DIAGNOSIS — F1014 Alcohol abuse with alcohol-induced mood disorder: Secondary | ICD-10-CM
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
## 2021-04-08 ENCOUNTER — Ambulatory Visit (INDEPENDENT_AMBULATORY_CARE_PROVIDER_SITE_OTHER): Payer: Medicaid Other | Admitting: Licensed Clinical Social Worker

## 2021-04-08 DIAGNOSIS — F1014 Alcohol abuse with alcohol-induced mood disorder: Secondary | ICD-10-CM | POA: Diagnosis not present
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
## 2021-04-17 ENCOUNTER — Emergency Department (HOSPITAL_COMMUNITY): Payer: Medicaid Other

## 2021-04-17 ENCOUNTER — Encounter (HOSPITAL_COMMUNITY): Payer: Self-pay

## 2021-04-17 ENCOUNTER — Emergency Department (HOSPITAL_COMMUNITY)
Admission: EM | Admit: 2021-04-17 | Discharge: 2021-04-17 | Disposition: A | Discharge status: Home / Self Care | Payer: Medicaid Other | Attending: Emergency Medicine | Admitting: Emergency Medicine

## 2021-04-17 DIAGNOSIS — R102 Pelvic and perineal pain: Secondary | ICD-10-CM | POA: Diagnosis present

## 2021-04-17 DIAGNOSIS — N83201 Unspecified ovarian cyst, right side: Secondary | ICD-10-CM

## 2021-04-17 DIAGNOSIS — J45909 Unspecified asthma, uncomplicated: Secondary | ICD-10-CM | POA: Insufficient documentation

## 2021-04-17 DIAGNOSIS — N898 Other specified noninflammatory disorders of vagina: Secondary | ICD-10-CM | POA: Insufficient documentation

## 2021-04-17 DIAGNOSIS — Z87891 Personal history of nicotine dependence: Secondary | ICD-10-CM | POA: Insufficient documentation

## 2021-04-17 LAB — URINALYSIS, ROUTINE W REFLEX MICROSCOPIC
Bilirubin Urine: NEGATIVE
Glucose, UA: NEGATIVE mg/dL
Hgb urine dipstick: NEGATIVE
Ketones, ur: NEGATIVE mg/dL
Leukocytes,Ua: NEGATIVE
Nitrite: NEGATIVE
Protein, ur: NEGATIVE mg/dL
Specific Gravity, Urine: >1.03 — ABNORMAL HIGH (ref 1.005–1.030)
pH: 6 (ref 5.0–8.0)

## 2021-04-17 LAB — WET PREP, GENITAL
Sperm: NONE SEEN
Trich, Wet Prep: NONE SEEN
Yeast Wet Prep HPF POC: NONE SEEN

## 2021-04-17 LAB — CBC
HCT: 39.5 % (ref 36.0–46.0)
Hemoglobin: 13.1 g/dL (ref 12.0–15.0)
MCH: 26.8 pg (ref 26.0–34.0)
MCHC: 33.2 g/dL (ref 30.0–36.0)
MCV: 80.9 fL (ref 80.0–100.0)
Platelets: 255 10*3/uL (ref 150–400)
RBC: 4.88 MIL/uL (ref 3.87–5.11)
RDW: 14.2 % (ref 11.5–15.5)
WBC: 18.4 10*3/uL — ABNORMAL HIGH (ref 4.0–10.5)
nRBC: 0 % (ref 0.0–0.2)

## 2021-04-17 LAB — COMPREHENSIVE METABOLIC PANEL
ALT: 8 U/L (ref 0–44)
AST: 19 U/L (ref 15–41)
Albumin: 3.9 g/dL (ref 3.5–5.0)
Alkaline Phosphatase: 53 U/L (ref 38–126)
Anion gap: 8 (ref 5–15)
BUN: 13 mg/dL (ref 6–20)
CO2: 23 mmol/L (ref 22–32)
Calcium: 8.8 mg/dL — ABNORMAL LOW (ref 8.9–10.3)
Chloride: 106 mmol/L (ref 98–111)
Creatinine, Ser: 0.64 mg/dL (ref 0.44–1.00)
GFR, Estimated: >60 mL/min (ref >60)
Glucose, Bld: 98 mg/dL (ref 70–99)
Potassium: 3.9 mmol/L (ref 3.5–5.1)
Sodium: 137 mmol/L (ref 135–145)
Total Bilirubin: 0.7 mg/dL (ref 0.3–1.2)
Total Protein: 7 g/dL (ref 6.5–8.1)

## 2021-04-17 LAB — I-STAT BETA HCG BLOOD, ED (MC, WL, AP ONLY): I-stat hCG, quantitative: <5 m[IU]/mL (ref <5)

## 2021-04-17 LAB — LIPASE, BLOOD: Lipase: 35 U/L (ref 11–51)

## 2021-04-17 MED ORDER — HYDROMORPHONE HCL 1 MG/ML IJ SOLN
1.0000 mg | Freq: Once | INTRAMUSCULAR | Status: AC
  Administered 2021-04-17: 1 mg via INTRAVENOUS
  Filled 2021-04-17: qty 1

## 2021-04-17 MED ORDER — ONDANSETRON HCL 4 MG/2ML IJ SOLN
4.0000 mg | Freq: Once | INTRAMUSCULAR | Status: AC
  Administered 2021-04-17: 4 mg via INTRAVENOUS
  Filled 2021-04-17: qty 2

## 2021-04-17 MED ORDER — SODIUM CHLORIDE 0.9 % IV BOLUS
1000.0000 mL | Freq: Once | INTRAVENOUS | Status: AC
  Administered 2021-04-17: 1000 mL via INTRAVENOUS

## 2021-04-17 MED ORDER — IOHEXOL 350 MG/ML SOLN
80.0000 mL | Freq: Once | INTRAVENOUS | Status: AC | PRN
  Administered 2021-04-17: 80 mL via INTRAVENOUS

## 2021-04-17 MED ORDER — OXYCODONE-ACETAMINOPHEN 5-325 MG PO TABS: 1.0000 | ORAL_TABLET | ORAL | 0 refills | Status: DC | PRN

## 2021-04-17 MED ORDER — ONDANSETRON HCL 8 MG PO TABS: 8.0000 mg | ORAL_TABLET | ORAL | 0 refills | Status: DC | PRN

## 2021-04-17 MED ORDER — MORPHINE SULFATE (PF) 4 MG/ML IV SOLN
4.0000 mg | Freq: Once | INTRAVENOUS | Status: AC
  Administered 2021-04-17: 4 mg via INTRAVENOUS
  Filled 2021-04-17: qty 1

## 2021-04-17 MED ORDER — OXYCODONE-ACETAMINOPHEN 5-325 MG PO TABS
1.0000 | ORAL_TABLET | Freq: Once | ORAL | Status: AC
  Administered 2021-04-17: 1 via ORAL
  Filled 2021-04-17: qty 1

## 2021-04-17 NOTE — ED Triage Notes (Addendum)
Pt came in with c/o bilateral abdominal pain that she states is shooting up into her abdomen or chest. Pt denies urinary symptoms and vaginal discharge. Pt is currently sexually active. Pt still has gallbladder and c/o R shoulder pain

## 2021-04-17 NOTE — ED Notes (Signed)
Patient ambulated to the bathroom with no issues.

## 2021-04-17 NOTE — ED Provider Notes (Signed)
7:45  AM-checkout from Dr. Pecola Leisure to evaluate patient after return of abdominal ultrasound, query torsion.  Previously had a CT abdomen pelvis that showed right ovarian cyst with probable hemorrhagic component and intraperitoneal blood.  9:20 AM-she states her pain is tolerable now but still 6/10 and would like some more medicine.  She feels that she can go home.  She states her last menstrual cycle, about a month ago lasted 3 weeks.  Otherwise she does not have chronic menorrhagia or irregular periods.  No prior history of ovarian cyst.  She is currently employed.  Discussed findings and plan with patient.  She will be given a work release until 04/21/2021.  Instructed her to follow-up with her gynecologist in the next 3 to 4 days for further care and treatment as needed.  Advised to return here as needed for unrelenting pain or additional concerning symptoms.  MDM-abdominal pain secondary to hemorrhagic cyst, with rupture.  Nontoxic appearance with reassuring vital signs.  This can be managed expectantly as an outpatient.  No indication for further ED intervention or hospitalization at this time.   Mancel Bale, MD 04/17/21 [phone]

## 2021-04-17 NOTE — ED Provider Notes (Signed)
[facility]-EMERGENCY DEPT Provider Note   CSN: [id number] Arrival date & time: 04/17/21  0359     History Chief Complaint  Patient presents with   Abdominal Pain    Sally Cardenas is a 30 y.o. female.  The history is provided by the patient and medical records.  Abdominal Pain Sally Cardenas is a 30 y.o. female who presents to the Emergency Department complaining of abdominal pain. Pain started Friday night. Initially it started as pelvic cramping and then it worsened and will confirm sleep. There is now shooting to the right chest. Feels like muscle spasms. Pain is constant but worse since at times. Is worse with movement. She denies any associated fever, nausea, vomiting, dysuria, diarrhea, constipation, vaginal discharge. She does have shortness of breath times when the pain is severe. No associated chest pain.  She has a history of asthma. No prior abdominal surgeries. She does take oral contraceptives. She smokes tobacco. She drinks alcohol   Sees [employer]  Past Medical History:  Diagnosis Date   Allergy    Anxiety    Asthma    Depression     Patient Active Problem List   Diagnosis Date Noted   Alcohol abuse with alcohol-induced mood disorder (HCC) 02/11/2021   Vaginal bleeding 09/26/2016    Past Surgical History:  Procedure Laterality Date   CERVICAL BIOPSY  W/ LOOP ELECTRODE EXCISION     DILATION AND EVACUATION N/A 04/24/2018   Procedure: DILATATION AND EVACUATION;  Surgeon: Hoover Browns, MD;  Location: [facility];  Service: Gynecology;  Laterality: N/A;     OB History     Gravida  1   Para  0   Term  0   Preterm  0   AB  0   Living  0      SAB  0   IAB  0   Ectopic  0   Multiple  0   Live Births  0           Family History  Problem Relation Age of Onset   Hyperlipidemia Mother    Hypertension Mother    Cancer Father     Social History   Tobacco Use   Smoking status: Former    Packs/day: 0.25     Types: Cigarettes, E-cigarettes    Quit date: 07/10/2015    Years since quitting: 5.7   Smokeless tobacco: Never  Vaping Use   Vaping Use: Former  Substance Use Topics   Alcohol use: Not Currently    Alcohol/week: 8.0 standard drinks    Types: 8 Standard drinks or equivalent per week    Comment: hard seltzer   Drug use: Yes    Types: Marijuana, Cocaine    Comment: joint a day. Recerational cocaine 1 night every two weeks    Home Medications Prior to Admission medications   Medication Sig Start Date End Date Taking? Authorizing Provider  acetaminophen (TYLENOL) 500 MG tablet Take 1,000 mg by mouth daily as needed for moderate pain or headache.    [provider]  albuterol (PROVENTIL HFA;VENTOLIN HFA) 108 (90 Base) MCG/ACT inhaler Inhale 2 puffs into the lungs every 6 (six) hours as needed for wheezing or shortness of breath.     [provider]  cetirizine (ZYRTEC) 10 MG tablet Take 10 mg by mouth daily as needed for allergies.    [provider]  escitalopram (LEXAPRO) 10 MG tablet Take 10 mg by mouth daily.    [provider]  fluticasone (FLONASE) 50 MCG/ACT nasal spray Place 1 spray into both nostrils daily as needed for allergies or rhinitis.    [provider]  ibuprofen (ADVIL) 800 MG tablet Take 1 tablet (800 mg total) by mouth every 8 (eight) hours as needed for moderate pain. 01/04/20   Long, Arlyss Repress, MD  methocarbamol (ROBAXIN) 500 MG tablet Take 1 tablet (500 mg total) by mouth every 8 (eight) hours as needed for muscle spasms. 01/04/20   Long, Arlyss Repress, MD  montelukast (SINGULAIR) 10 MG tablet Take 10 mg by mouth daily.     [provider]  Prenatal Vit-Fe Fumarate-FA (PRENATAL MULTIVITAMIN) TABS tablet Take 1 tablet by mouth daily.     [provider]  triamcinolone ointment (KENALOG) 0.1 % Apply 1 application topically 2 (two) times daily as needed (eczema).     [provider]  valACYclovir (VALTREX)  500 MG tablet Take 500 mg by mouth 2 (two) times daily as needed (fever blisters).  04/05/18   [provider]    Allergies    Patient has no known allergies.  Review of Systems   Review of Systems  Gastrointestinal:  Positive for abdominal pain.  All other systems reviewed and are negative.  Physical Exam Updated Vital Signs BP 95/80   Pulse 84   Temp 98.2 F (36.8 C) (Oral)   Resp 15   Ht 5' (1.524 m)   Wt 63.5 kg   SpO2 100%   BMI 27.34 kg/m   Physical Exam Vitals and nursing note reviewed.  Constitutional:      Appearance: She is well-developed.  HENT:     Head: Normocephalic and atraumatic.  Cardiovascular:     Rate and Rhythm: Normal rate and regular rhythm.     Heart sounds: No murmur heard. Pulmonary:     Effort: Pulmonary effort is normal. No respiratory distress.     Breath sounds: Normal breath sounds.  Abdominal:     General: There is distension.     Tenderness: There is abdominal tenderness.     Comments: Moderate generalized abdominal tenderness with guarding  Genitourinary:    Comments: Scant vaginal discharge Musculoskeletal:        General: No tenderness.  Skin:    General: Skin is warm and dry.  Neurological:     Mental Status: She is alert and oriented to person, place, and time.  Psychiatric:        Behavior: Behavior normal.    ED Results / Procedures / Treatments   Labs (all labs ordered are listed, but only abnormal results are displayed) Labs Reviewed  CBC - Abnormal; Notable for the following components:      Result Value   WBC 18.4 (*)    All other components within normal limits  COMPREHENSIVE METABOLIC PANEL - Abnormal; Notable for the following components:   Calcium 8.8 (*)    All other components within normal limits  WET PREP, GENITAL  LIPASE, BLOOD  URINALYSIS, ROUTINE W REFLEX MICROSCOPIC  I-STAT BETA HCG BLOOD, ED ([facility], [facility], [facility] ONLY)  GC/CHLAMYDIA PROBE AMP ([place]) NOT AT [facility]     EKG None  Radiology CT Abdomen Pelvis W Contrast  Result Date: 04/17/2021 CLINICAL DATA:  Abdominal distension. Bilateral abdominal pain which extends into abdomen/chest. EXAM: CT ABDOMEN AND PELVIS WITH CONTRAST TECHNIQUE: Multidetector CT imaging of the abdomen and pelvis was performed using the standard protocol following bolus administration of intravenous contrast. CONTRAST:  42mL OMNIPAQUE IOHEXOL 350  MG/ML SOLN COMPARISON:  None. FINDINGS: Lower chest: Subsegmental atelectasis noted in the left base. No pleural effusion or airspace consolidation. Hepatobiliary: No focal liver abnormality is seen. No gallstones, gallbladder wall thickening, or biliary dilatation. Pancreas: Unremarkable. No pancreatic ductal dilatation or surrounding inflammatory changes. Spleen: Normal in size without focal abnormality. Adrenals/Urinary Tract: Normal adrenal glands. No mass or hydronephrosis identified bilaterally. The urinary bladder appears unremarkable. Stomach/Bowel: Stomach is within normal limits. Appendix appears normal. No evidence of bowel wall thickening, distention, or inflammatory changes. Vascular/Lymphatic: No significant vascular findings are present. No enlarged abdominal or pelvic lymph nodes. Reproductive: The uterus appears normal. Right ovary unremarkable. Heterogeneous soft tissue density mass within the left adnexa measures 7.0 x 4.2 by 4.9 cm. This contains a central area of hyperdensity measuring 1.2 cm. Other: There is a moderate volume of hyperdense fluid within the abdomen and pelvis which measures 40 Hounsfield units compatible with hemoperitoneum. Musculoskeletal: No acute or significant osseous findings. IMPRESSION: 1. There is a large soft tissue density mass within the left adnexa with central area of hyperdensity which in the acute setting may represent a complex/hemorrhagic cyst. There is a moderate volume of hemoperitoneum within the abdomen and pelvis. Imaging are compatible with  ruptured hemorrhagic ovarian cyst. Advise further evaluation with pelvic sonogram. 2. These results were called by telephone at the time of interpretation on 04/17/2021 at 6:15 am to provider [facility] , who verbally acknowledged these results. Electronically Signed   By: Signa Kell M.D.   On: 04/17/2021 06:15    Procedures Procedures   Medications Ordered in ED Medications  morphine 4 MG/ML injection 4 mg (4 mg Intravenous Given 04/17/21 0503)  ondansetron (ZOFRAN) injection 4 mg (4 mg Intravenous Given 04/17/21 0502)  sodium chloride 0.9 % bolus 1,000 mL (0 mLs Intravenous Stopped 04/17/21 0549)  iohexol (OMNIPAQUE) 350 MG/ML injection 80 mL (80 mLs Intravenous Contrast Given 04/17/21 0519)  HYDROmorphone (DILAUDID) injection 1 mg (1 mg Intravenous Given 04/17/21 4196)    ED Course  I have reviewed the triage vital signs and the nursing notes.  Pertinent labs & imaging results that were available during my care of the patient were reviewed by me and considered in my medical decision making (see chart for details).    MDM Rules/Calculators/A&P                          patient here for evaluation of abdominal pain. She has peritonitis on examination. Imaging is concerning for hemorrhagic ovarian cyst. Pelvic examination with scant discharge, no evidence of infection. Plan to obtain pelvic ultrasound to further evaluate ovarian cyst. Patient care transferred pending pelvic ultrasound.  Final Clinical Impression(s) / ED Diagnoses Final diagnoses:  Pelvic pain    Rx / DC Orders ED Discharge Orders     None        Tilden Fossa, MD 04/17/21 [phone]

## 2021-04-17 NOTE — Discharge Instructions (Signed)
It appears your abdominal pain is from a cyst, right-sided ovary, that has ruptured.  Usually, this is a self-limited process and will improve over the next several days to weeks.  It is important to follow-up with your gynecologist for further care and treatment and close observation.  Call them on Monday morning for a follow-up appointment.  If your condition worsens, return here or see the doctor of your choice.

## 2021-04-19 LAB — GC/CHLAMYDIA PROBE AMP (~~LOC~~) NOT AT ARMC
Chlamydia: NEGATIVE
Comment: NEGATIVE
Comment: NORMAL
Neisseria Gonorrhea: NEGATIVE

## 2021-04-28 IMAGING — CT CT HEAD W/O CM
4 series · 15 of 47 positions shown, 17 images · non-contrast
Comparison: None.

CLINICAL DATA: Head trauma, altered mental status, fall from ladder
last night. Dizziness and headache.

EXAM:
CT HEAD WITHOUT CONTRAST
CT CERVICAL SPINE WITHOUT CONTRAST
TECHNIQUE: Multidetector CT imaging of the head and cervical spine was
performed following the standard protocol without intravenous
contrast. Multiplanar CT image reconstructions of the cervical spine
were also generated.

[Series 3: head without · axial · non-contrast · 0.42mm/px · z∈[+1284,+1404]mm · 7 of 32 slices shown, 9 images]
[im 4/32  brain]
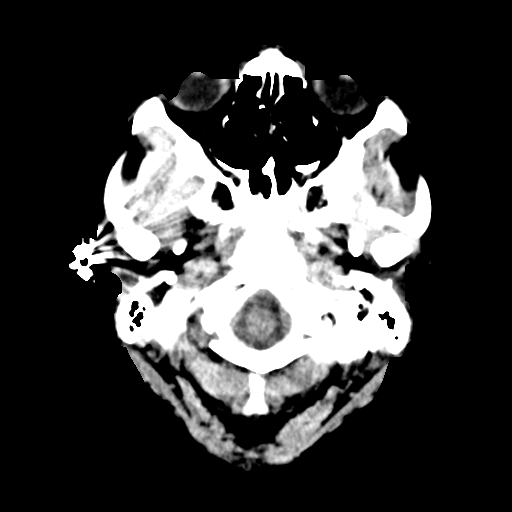
[im 4/32  bone]
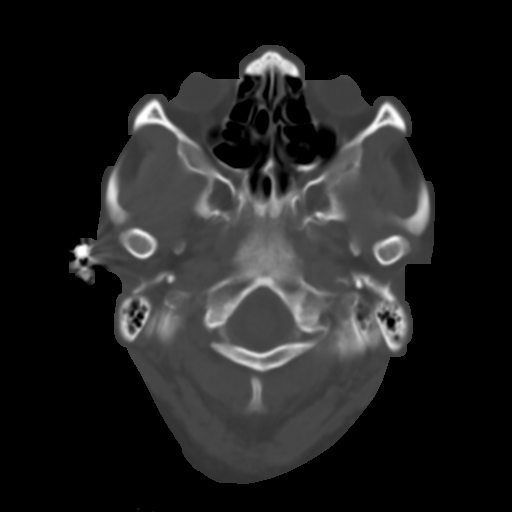
[im 8/32  brain]
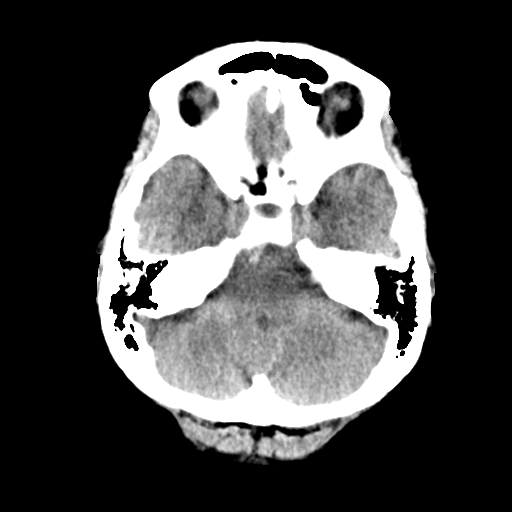
[im 12/32  brain]
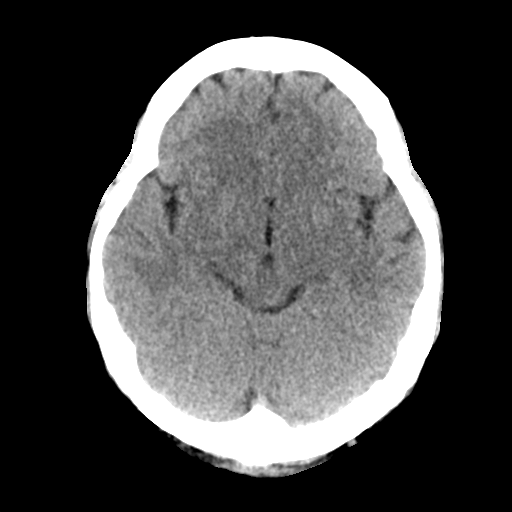
[im 16/32  brain]
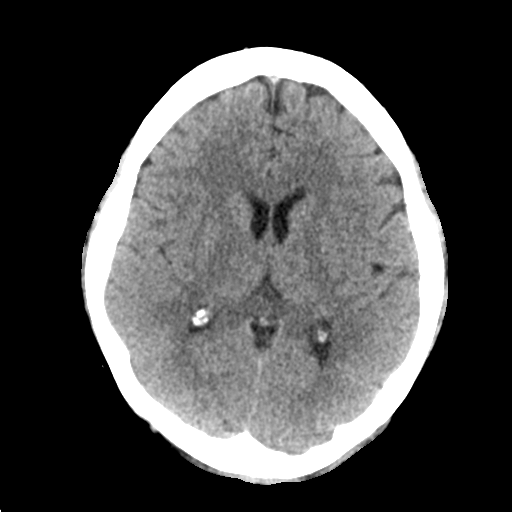
[im 20/32  brain]
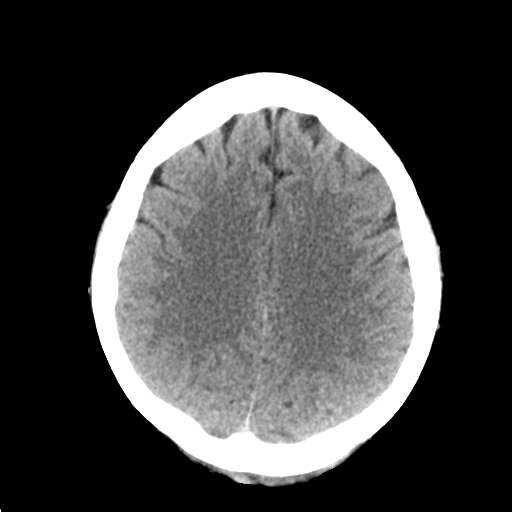
[im 20/32  bone]
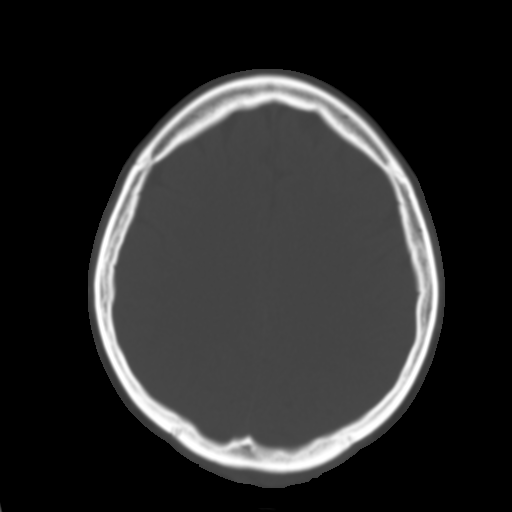
[im 24/32  brain]
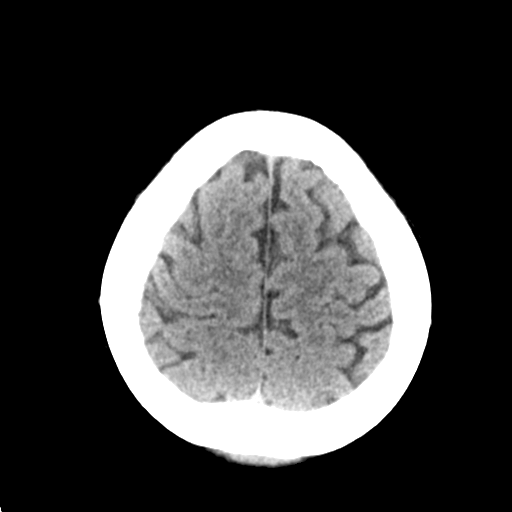
[im 28/32  brain]
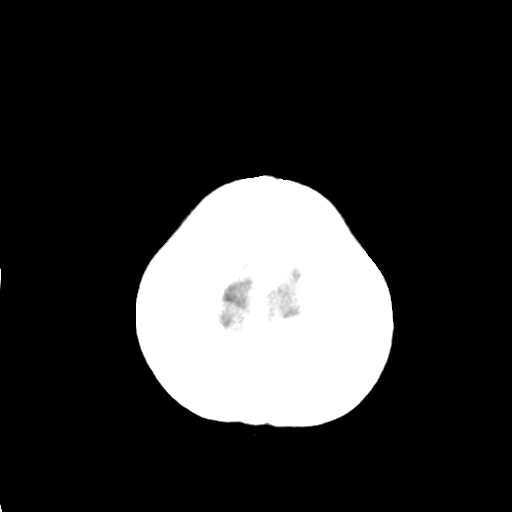

[Series 4: head bone · axial · 0.42mm/px · z∈[+1283,+1299]mm · 2 of 78 slices shown]
[im 8/78  bone]
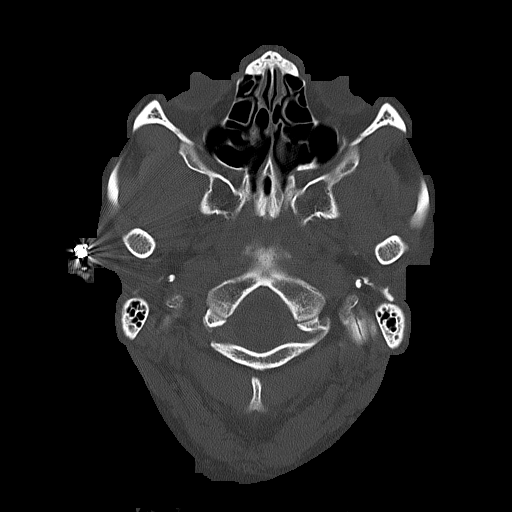
[im 16/78  bone]
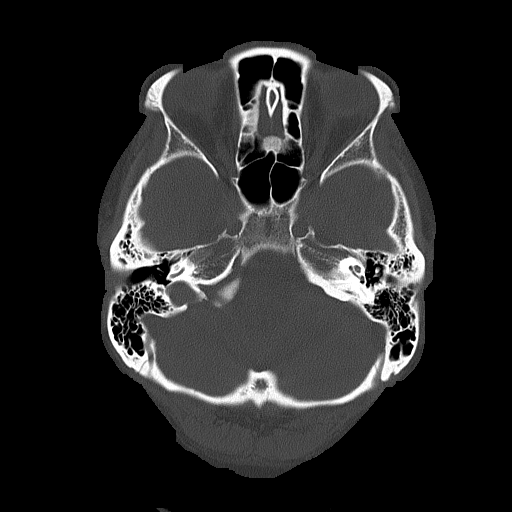

[Series 5: head without cor · coronal · non-contrast · 0.34mm/px · 3 of 69 slices shown]
[im 23/69  brain]
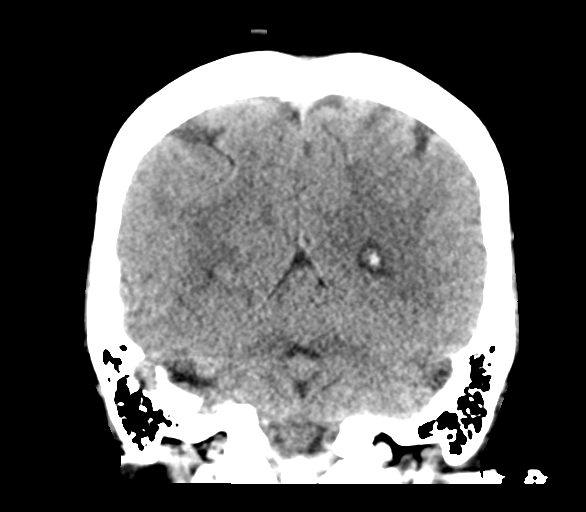
[im 31/69  brain]
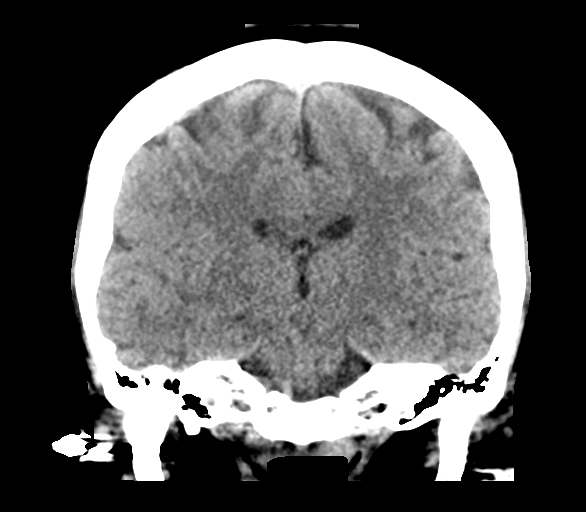
[im 38/69  brain]
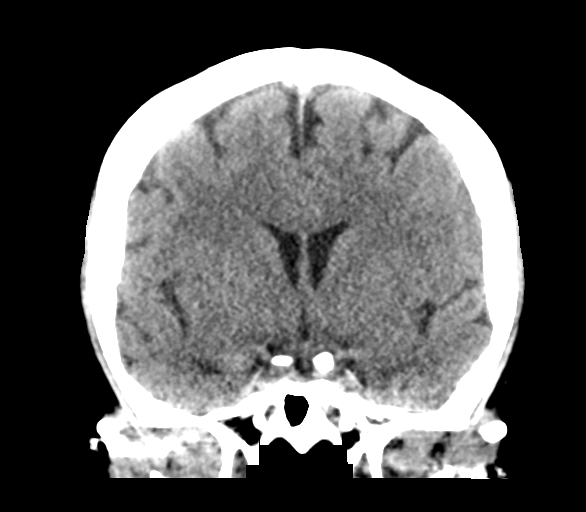

[Series 6: head without sag · sagittal · non-contrast · 0.34mm/px · 3 of 55 slices shown]
[im 19/55  brain]
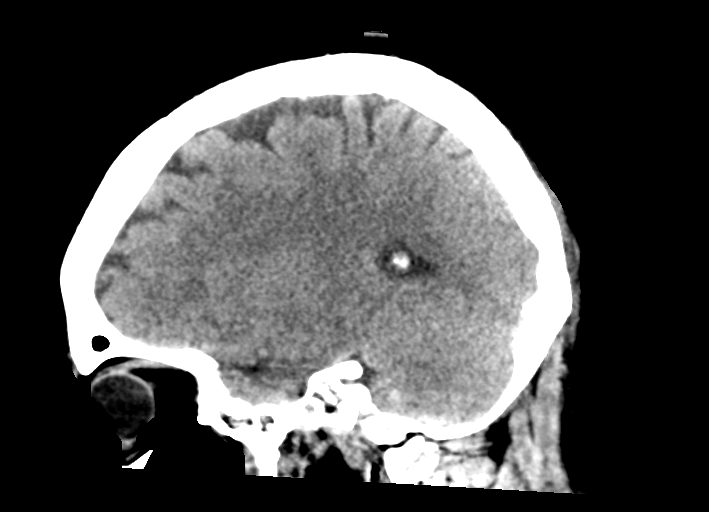
[im 28/55  brain]
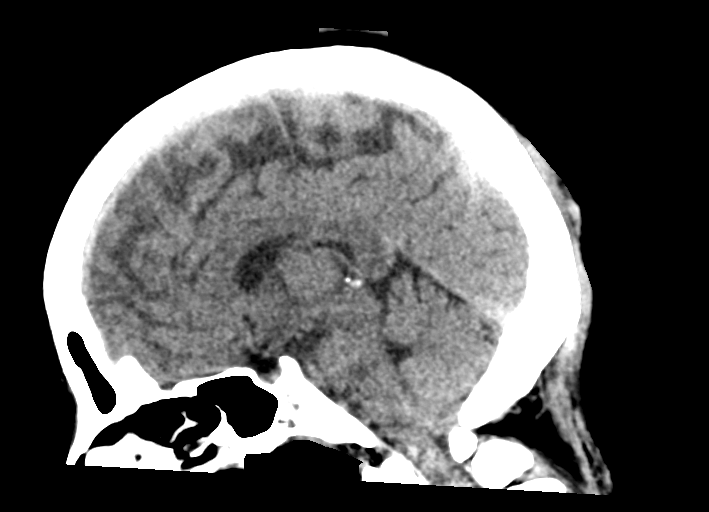
[im 37/55  brain]
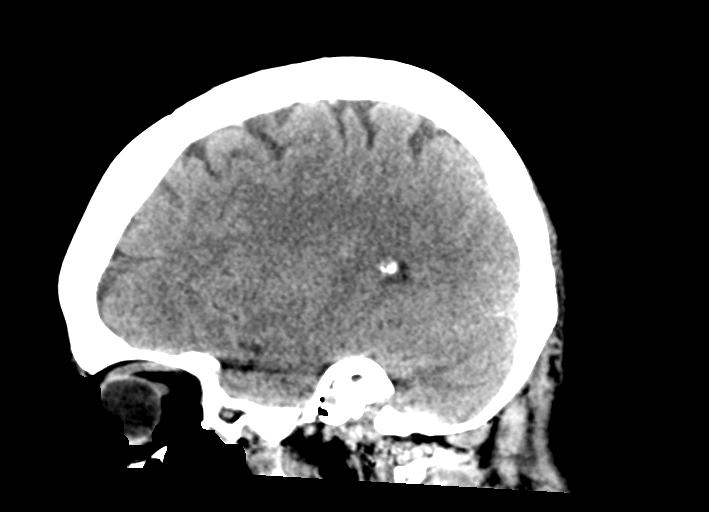

[15 of 47 positions shown; findings below may reference images not displayed]

FINDINGS: CT HEAD FINDINGS

Brain: Earrings create artifact. No acute intracranial hemorrhage,
mass lesion, acute infarction, midline shift, herniation,
hydrocephalus, or extra-axial fluid collection. No focal mass effect
or edema. Cisterns are patent. No cerebellar abnormality.

Vascular: No hyperdense vessel or unexpected calcification.

Skull: Normal. Negative for fracture or focal lesion.

Sinuses/Orbits: No acute finding.

Other: None.

CT CERVICAL SPINE FINDINGS

Alignment: Loss of normal lordosis. Slight kyphotic curvature
appearing positional. Facets are aligned. No subluxation or
dislocation.

Skull base and vertebrae: No acute fracture. No primary bone lesion
or focal pathologic process.

Soft tissues and spinal canal: No prevertebral fluid or swelling. No
visible canal hematoma.

Disc levels:  Preserved vertebral body heights and disc spaces.

Upper chest: Negative.

Other: None.
IMPRESSION: Normal head CT without contrast.

No acute cervical spine fracture or malalignment. Mild cervical
kyphotic curvature appearing positional.

## 2021-05-06 ENCOUNTER — Ambulatory Visit (INDEPENDENT_AMBULATORY_CARE_PROVIDER_SITE_OTHER): Payer: Medicaid Other | Admitting: Licensed Clinical Social Worker

## 2021-05-06 DIAGNOSIS — F1014 Alcohol abuse with alcohol-induced mood disorder: Secondary | ICD-10-CM
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
## 2021-05-10 ENCOUNTER — Encounter (HOSPITAL_COMMUNITY): Payer: Self-pay

## 2021-05-10 ENCOUNTER — Other Ambulatory Visit: Payer: Self-pay

## 2021-05-10 ENCOUNTER — Ambulatory Visit (INDEPENDENT_AMBULATORY_CARE_PROVIDER_SITE_OTHER): Payer: Medicaid Other | Admitting: Psychiatry

## 2021-05-10 DIAGNOSIS — F3132 Bipolar disorder, current episode depressed, moderate: Secondary | ICD-10-CM

## 2021-05-10 HISTORY — DX: Bipolar disorder, current episode depressed, moderate: F31.32

## 2021-05-10 MED ORDER — DIVALPROEX SODIUM 500 MG PO DR TAB: 500.0000 mg | DELAYED_RELEASE_TABLET | Freq: Two times a day (BID) | ORAL | 2 refills | Status: DC

## 2021-05-10 MED ORDER — ESCITALOPRAM OXALATE 10 MG PO TABS: 10.0000 mg | ORAL_TABLET | Freq: Every day | ORAL | 2 refills | Status: DC

## 2021-05-10 NOTE — Progress Notes (Signed)
Psychiatric Initial Adult Assessment   Patient Identification: Sally Cardenas MRN:  [id number] Date of Evaluation:  05/10/2021 Referral Source: ED Chief Complaint:   Chief Complaint   Anxiety; Depression; Establish Care    Visit Diagnosis:    ICD-10-CM   1. Bipolar affective disorder, depressed, moderate (HCC)  F31.32       History of Present Illness:   30 year old female presenting today with a history of disordered alcohol and cocaine use, with complaints of irritability, anxiety and depression; past history of the same. The patient reports being on medications prior that were stopped during a positive pregnancy, and feeling like she needs them restarted due to a resurgence of symptoms.  On exam the patient is calm and cooperative, denies any hallucinations, delusions, suicidal ideation or homicidal ideations. Patient states having a good social support, but being in a challenging relationship that is causing her to feel more isolated; currently lives with her partner but denies any current abuse or safety issues. The patient does state having periods of time where she feels "great" and has "a lot of energy," although denies any sleep issues surrounding this, and other periods of time in which she "sleeps all day" and feels very frustrated. Depression is a 7/10 and high anxiety at times with panic attacks at times.  No past trauma.  She does drink alcohol and has been able to decrease use to twice weekly since starting therapy, drinks seltzer beers.  Occasional use of cocaine, vapes nicotine daily, and some cannabis use daily.  The patient reports currently taking 10 mg of Lexapro, and having been on Depakote and Wellbutrin in the past. Recommendation is to restart Depakote at 500 mg, and continue her Lexapro 10 mg daily and to re-assess in four weeks to monitor the outcome of the medications.  Associated Signs/Symptoms: Depression Symptoms:  depressed mood, hypersomnia, (Hypo) Manic  Symptoms:  Irritable Mood, Anxiety Symptoms:  Excessive Worry, Panic Symptoms, Psychotic Symptoms:   none PTSD Symptoms: Negative  Past Psychiatric History: depression, bipolar d/o, anxiety, panic d/o, alcohol abuse  Previous Psychotropic Medications: Yes   Substance Abuse History in the last 12 months:  Yes.    Consequences of Substance Abuse: Legal Consequences:  court daes  Past Medical History:  Past Medical History:  Diagnosis Date   Allergy    Anxiety    Asthma    Depression     Past Surgical History:  Procedure Laterality Date   CERVICAL BIOPSY  W/ LOOP ELECTRODE EXCISION     DILATION AND EVACUATION N/A 04/24/2018   Procedure: DILATATION AND EVACUATION;  Surgeon: Hoover Browns, MD;  Location: [facility];  Service: Gynecology;  Laterality: N/A;    Family Psychiatric History: father with depression and alcohol use disorder  Family History:  Family History  Problem Relation Age of Onset   Hyperlipidemia Mother    Hypertension Mother    Cancer Father     Social History:   Social History   Socioeconomic History   Marital status: Single    Spouse name: Not on file   Number of children: Not on file   Years of education: Not on file   Highest education level: Not on file  Occupational History   Not on file  Tobacco Use   Smoking status: Former    Packs/day: 0.25    Types: Cigarettes, E-cigarettes    Quit date: 07/10/2015    Years since quitting: 5.8   Smokeless tobacco: Never  Vaping Use   Vaping Use: Former  Substance and Sexual Activity   Alcohol use: Not Currently    Alcohol/week: 8.0 standard drinks    Types: 8 Standard drinks or equivalent per week    Comment: hard seltzer   Drug use: Yes    Types: Marijuana, Cocaine    Comment: joint a day. Recerational cocaine 1 night every two weeks   Sexual activity: Yes    Partners: Male    Birth control/protection: None  Other Topics Concern   Not on file  Social History Narrative   Not on file   Social  Determinants of Health   Financial Resource Strain: Low Risk    Difficulty of Paying Living Expenses: Not very hard  Food Insecurity: No Food Insecurity   Worried About Programme researcher, broadcasting/film/video in the Last Year: Never true   Ran Out of Food in the Last Year: Never true  Transportation Needs: No Transportation Needs   Lack of Transportation (Medical): No   Lack of Transportation (Non-Medical): No  Physical Activity: Inactive   Days of Exercise per Week: 0 days   Minutes of Exercise per Session: 0 min  Stress: Stress Concern Present   Feeling of Stress : To some extent  Social Connections: Moderately Integrated   Frequency of Communication with Friends and Family: More than three times a week   Frequency of Social Gatherings with Friends and Family: Twice a week   Attends Religious Services: 1 to 4 times per year   Active Member of [employer] or Organizations: No   Attends Engineer, structural: Never   Marital Status: Living with partner    Additional Social History: lives with her partner  Allergies:  No Known Allergies  Metabolic Disorder Labs: No results found for: HGBA1C, MPG No results found for: PROLACTIN No results found for: CHOL, TRIG, HDL, CHOLHDL, VLDL, LDLCALC No results found for: TSH  Therapeutic Level Labs: No results found for: LITHIUM No results found for: CBMZ No results found for: VALPROATE  Current Medications: Current Outpatient Medications  Medication Sig Dispense Refill   acetaminophen (TYLENOL) 500 MG tablet Take 1,000 mg by mouth daily as needed for moderate pain or headache.     albuterol (PROVENTIL HFA;VENTOLIN HFA) 108 (90 Base) MCG/ACT inhaler Inhale 2 puffs into the lungs every 6 (six) hours as needed for wheezing or shortness of breath.      cetirizine (ZYRTEC) 10 MG tablet Take 10 mg by mouth daily as needed for allergies.     escitalopram (LEXAPRO) 10 MG tablet Take 10 mg by mouth daily.     fluticasone (FLONASE) 50 MCG/ACT nasal spray Place  1 spray into both nostrils daily as needed for allergies or rhinitis.     ibuprofen (ADVIL) 800 MG tablet Take 1 tablet (800 mg total) by mouth every 8 (eight) hours as needed for moderate pain. 21 tablet 0   methocarbamol (ROBAXIN) 500 MG tablet Take 1 tablet (500 mg total) by mouth every 8 (eight) hours as needed for muscle spasms. 20 tablet 0   montelukast (SINGULAIR) 10 MG tablet Take 10 mg by mouth daily.      norethindrone (MICRONOR) 0.35 MG tablet Take 0.35 mg by mouth daily.     Prenatal Vit-Fe Fumarate-FA (PRENATAL MULTIVITAMIN) TABS tablet Take 1 tablet by mouth daily.      triamcinolone ointment (KENALOG) 0.1 % Apply 1 application topically 2 (two) times daily as needed (eczema).      valACYclovir (VALTREX) 500 MG tablet Take 500 mg by mouth 2 (two)  times daily as needed (fever blisters).   1   No current facility-administered medications for this visit.    Musculoskeletal: Strength & Muscle Tone: within normal limits Gait & Station: normal Patient leans: N/A  Psychiatric Specialty Exam: Review of Systems  Psychiatric/Behavioral:  Positive for dysphoric mood and sleep disturbance. The patient is nervous/anxious.   All other systems reviewed and are negative.  Blood pressure 118/89, pulse 70, height 5' (1.524 m), weight 140 lb (63.5 kg), unknown if currently breastfeeding.Body mass index is 27.34 kg/m.  General Appearance: Casual  Eye Contact:  Good  Speech:  Normal Rate  Volume:  Normal  Mood:  Anxious and Depressed  Affect:  Congruent  Thought Process:  Coherent and Descriptions of Associations: Intact  Orientation:  Full (Time, Place, and Person)  Thought Content:  Rumination  Suicidal Thoughts:  No  Homicidal Thoughts:  No  Memory:  Immediate;   Good Recent;   Good Remote;   Good  Judgement:  Good  Insight:  Good  Psychomotor Activity:  Decreased  Concentration:  Concentration: Good and Attention Span: Good  Recall:  Good  Fund of Knowledge:Good  Language:  Good  Akathisia:  No  Handed:  Right  AIMS (if indicated):  not done  Assets:  Desire for Improvement Housing Leisure Time Physical Health Resilience Social Support  ADL's:  Intact  Cognition: WNL  Sleep:  Fair   Screenings: AUDIT    Advertising copywriter from 04/08/2021 in [facility] Counselor from 02/11/2021 in [facility]  Alcohol Use Disorder Identification Test Final Score (AUDIT) 24 23      GAD-7    Flowsheet Row Counselor from 05/06/2021 in [facility] Counselor from 04/08/2021 in [facility]  Total GAD-7 Score 6 7      PHQ2-9    Flowsheet Row Office Visit from 05/10/2021 in [facility] Counselor from 05/06/2021 in [facility] Counselor from 04/08/2021 in [facility] Counselor from 02/11/2021 in [facility] Office Visit from 08/10/2015 in [facility] at [facility] Total Score 2 2 2 2  0  PHQ-9 Total Score 7 7 6 5  --      Flowsheet Row Office Visit from 05/10/2021 in [facility] ED from 04/17/2021 in [facility] Counselor from 02/11/2021 in [facility]  C-SSRS RISK CATEGORY No Risk No Risk No Risk       Assessment and Plan:  Bipolar affective disorder, depressed, moderate: -Restart Depakote 500 mg BID -Continue Lexapro 10 mg daily -Continue therapy  General anxiety disorder: -continue therapy  Alcohol use disorder: -Refrain from alcohol and drug use -Attend 12-step program   Virtual Visit via Video Note  I connected with 02/13/2021 on 05/10/21 at  8:30 AM EDT by a video enabled telemedicine application and verified that I am speaking with the correct person using two identifiers.  Location: Patient: home Provider: home office    I discussed the limitations of evaluation and management by telemedicine and the availability of in person appointments. The patient expressed understanding and agreed to proceed.  Follow Up Instructions: Follow up in 4 weeks   I discussed the assessment and treatment plan with the patient. The patient was provided an opportunity to ask questions and all were answered. The patient agreed with the plan and demonstrated an understanding of the instructions.  The patient was advised to call back or seek an in-person evaluation if the symptoms worsen or if the condition fails to improve as anticipated.  I provided 60 minutes of non-face-to-face time during this encounter.   Nanine Means, NP    Nanine Means, NP 10/17/20228:44 AM

## 2021-06-03 ENCOUNTER — Ambulatory Visit (INDEPENDENT_AMBULATORY_CARE_PROVIDER_SITE_OTHER): Payer: Medicaid Other | Admitting: Licensed Clinical Social Worker

## 2021-06-03 DIAGNOSIS — F3132 Bipolar disorder, current episode depressed, moderate: Secondary | ICD-10-CM

## 2021-06-03 NOTE — Progress Notes (Signed)
   THERAPIST PROGRESS NOTE  Session Time: 67  Virtual Visit via Video Note  I connected with Sally Cardenas on 06/03/21 at  8:00 AM EST by a video enabled telemedicine application and verified that I am speaking with the correct person using two identifiers.  Location: Patient: [facility]  Provider: [facility]    I discussed the limitations of evaluation and management by telemedicine and the availability of in person appointments. The patient expressed understanding and agreed to proceed.   Pt was alert and oriented x 5. She was dressed casually and engaged well in therapy session. She was pleasant cooperative and maintained good eye contact. She presented with depressed and irritable mood/affect.   Primary stressor are alcohol abuse, relationships, and financials. Pt reports that she has been struggling with finding trust for her significant other. She reports that he cheated on her when he worked a Recruitment consultant last month. Pt states that this has increased her depression and anxiety. Sally Cardenas states even when he goes to bars, I do not trust he is not talking to other girls. Pt states that when he goes to bars, she does too, and pt will drink. She knows the consequences of drinking and reports that she has passive suicidal thoughts along with increased depression when she does it. Sally Cardenas reports that financials have not been good, because she is so far behind on them.    Plan/Interventions: LCSW educated patient on the effects of alcohol. Spoke with pt that it is a depressant and how it may affect the effectiveness of her medications. LCSW educated pt that one of the critical of alcohol abuse it continue use despite the negative effects. Plan for pt is to decrease alcohol intake to 0 days per week. LCSW utilized language for praise, encouragement, and empowerment. LCSW utilized reframing and educating pt on cognitive distortions. Pan to review her relationship to reflect on  the pros and cons of it    I discussed the assessment and treatment plan with the patient. The patient was provided an opportunity to ask questions and all were answered. The patient agreed with the plan and demonstrated an understanding of the instructions.   The patient was advised to call back or seek an in-person evaluation if the symptoms worsen or if the condition fails to improve as anticipated.  I provided 40 minutes of non-face-to-face time during this encounter.   Weber Cooks, LCSW   Participation Level: Active  Behavioral Response: Casual and Fairly GroomedAlertAnxious and Depressed  Type of Therapy: Individual Therapy  Treatment Goals addressed: Anxiety and Diagnosis: Major depression  Interventions: CBT and Supportive  Suicidal/Homicidal: NAwithout intent/plan   Plan: Return again in 4 weeks.      Weber Cooks, LCSW 06/03/2021

## 2021-06-07 ENCOUNTER — Encounter (HOSPITAL_COMMUNITY): Payer: Self-pay

## 2021-06-07 ENCOUNTER — Telehealth (INDEPENDENT_AMBULATORY_CARE_PROVIDER_SITE_OTHER): Payer: Medicaid Other | Admitting: Psychiatry

## 2021-06-07 DIAGNOSIS — F1020 Alcohol dependence, uncomplicated: Secondary | ICD-10-CM

## 2021-06-07 DIAGNOSIS — F3132 Bipolar disorder, current episode depressed, moderate: Secondary | ICD-10-CM | POA: Diagnosis not present

## 2021-06-07 HISTORY — DX: Alcohol dependence, uncomplicated: F10.20

## 2021-06-07 MED ORDER — DIVALPROEX SODIUM ER 500 MG PO TB24: 1000.0000 mg | ORAL_TABLET | Freq: Every day | ORAL | 2 refills | Status: DC

## 2021-06-07 MED ORDER — ESCITALOPRAM OXALATE 10 MG PO TABS: 10.0000 mg | ORAL_TABLET | Freq: Every day | ORAL | 2 refills | Status: AC

## 2021-06-07 NOTE — Progress Notes (Signed)
BH NP OP Progress Note  Patient Identification: Sally Cardenas MRN:  [id number] Date of Evaluation:  06/07/2021 Referral Source: ED Chief Complaint:   Chief Complaint   Depression; Follow-up    Visit Diagnosis:    ICD-10-CM   1. Bipolar affective disorder, depressed, moderate (HCC)  F31.32     2. Uncomplicated alcohol dependence (HCC)  F10.20       History of Present Illness:   30 year old female with a history of anxiety and bipolar depression, alcohol and cocaine use,  presents for a follow-up to assess medication management. The patient reports feeling better than prior, with a more stable mood since beginning medications. She rates her depression as a 5/10, improved from before, and her anxiety at a 3.5/10, also improved. The patient does describe still having episodes in which she goes from getting little sleep, to oversleeping, but places this in the context of binge drinking, with her oversleeping following an episode of excessive alcohol consumption. The patient is offered resources for alcohol-use management, and states having been cutting back within the past two weeks with some success. She also states concern regarding becoming depressed in the wintertime, but states that his winter she feels improved: given education regarding alcohol and thiamine use and encouraged to take a multivitamin that contains B12 and Vitamin D to help with seasonal mood fluctuations. She reports a good appetite, and states comfort with maintaining the current medication regimen with a follow-up in 3 months to reassess. Denies any suicidal ideation, homicidal ideation, delusions, hallucinations, or other drug use at this time, and states feeling comfortable reaching out if this were to change.   Past Psychiatric History: depression, bipolar d/o, anxiety, panic d/o, alcohol abuse  Previous Psychotropic Medications: Yes   Substance Abuse History in the last 12 months:  Yes.    Consequences of Substance  Abuse: Legal Consequences:  court daes  Past Medical History:  Past Medical History:  Diagnosis Date   Allergy    Anxiety    Asthma    Depression     Past Surgical History:  Procedure Laterality Date   CERVICAL BIOPSY  W/ LOOP ELECTRODE EXCISION     DILATION AND EVACUATION N/A 04/24/2018   Procedure: DILATATION AND EVACUATION;  Surgeon: Hoover Browns, MD;  Location: [facility];  Service: Gynecology;  Laterality: N/A;    Family Psychiatric History: father with depression and alcohol use disorder  Family History:  Family History  Problem Relation Age of Onset   Hyperlipidemia Mother    Hypertension Mother    Cancer Father     Social History:   Social History   Socioeconomic History   Marital status: Single    Spouse name: Not on file   Number of children: Not on file   Years of education: Not on file   Highest education level: Not on file  Occupational History   Not on file  Tobacco Use   Smoking status: Former    Packs/day: 0.25    Types: Cigarettes, E-cigarettes    Quit date: 07/10/2015    Years since quitting: 5.9   Smokeless tobacco: Never  Vaping Use   Vaping Use: Former  Substance and Sexual Activity   Alcohol use: Not Currently    Alcohol/week: 8.0 standard drinks    Types: 8 Standard drinks or equivalent per week    Comment: hard seltzer   Drug use: Yes    Types: Marijuana, Cocaine    Comment: joint a day. Recerational cocaine 1 night every two  weeks   Sexual activity: Yes    Partners: Male    Birth control/protection: None  Other Topics Concern   Not on file  Social History Narrative   Not on file   Social Determinants of Health   Financial Resource Strain: Low Risk    Difficulty of Paying Living Expenses: Not very hard  Food Insecurity: No Food Insecurity   Worried About Programme researcher, broadcasting/film/video in the Last Year: Never true   Ran Out of Food in the Last Year: Never true  Transportation Needs: No Transportation Needs   Lack of Transportation  (Medical): No   Lack of Transportation (Non-Medical): No  Physical Activity: Inactive   Days of Exercise per Week: 0 days   Minutes of Exercise per Session: 0 min  Stress: Stress Concern Present   Feeling of Stress : To some extent  Social Connections: Moderately Integrated   Frequency of Communication with Friends and Family: More than three times a week   Frequency of Social Gatherings with Friends and Family: Twice a week   Attends Religious Services: 1 to 4 times per year   Active Member of [employer] or Organizations: No   Attends Engineer, structural: Never   Marital Status: Living with partner    Additional Social History: lives with her partner  Allergies:  No Known Allergies  Metabolic Disorder Labs: No results found for: HGBA1C, MPG No results found for: PROLACTIN No results found for: CHOL, TRIG, HDL, CHOLHDL, VLDL, LDLCALC No results found for: TSH  Therapeutic Level Labs: No results found for: LITHIUM No results found for: CBMZ No results found for: VALPROATE  Current Medications: Current Outpatient Medications  Medication Sig Dispense Refill   divalproex (DEPAKOTE ER) 500 MG 24 hr tablet Take 2 tablets (1,000 mg total) by mouth daily. 60 tablet 2   acetaminophen (TYLENOL) 500 MG tablet Take 1,000 mg by mouth daily as needed for moderate pain or headache.     albuterol (PROVENTIL HFA;VENTOLIN HFA) 108 (90 Base) MCG/ACT inhaler Inhale 2 puffs into the lungs every 6 (six) hours as needed for wheezing or shortness of breath.      cetirizine (ZYRTEC) 10 MG tablet Take 10 mg by mouth daily as needed for allergies.     escitalopram (LEXAPRO) 10 MG tablet Take 1 tablet (10 mg total) by mouth daily. 30 tablet 2   fluticasone (FLONASE) 50 MCG/ACT nasal spray Place 1 spray into both nostrils daily as needed for allergies or rhinitis.     ibuprofen (ADVIL) 800 MG tablet Take 1 tablet (800 mg total) by mouth every 8 (eight) hours as needed for moderate pain. 21 tablet 0    montelukast (SINGULAIR) 10 MG tablet Take 10 mg by mouth daily.      valACYclovir (VALTREX) 500 MG tablet Take 500 mg by mouth 2 (two) times daily as needed (fever blisters).   1   No current facility-administered medications for this visit.    Musculoskeletal: Strength & Muscle Tone: within normal limits Gait & Station: normal Patient leans: N/A  Psychiatric Specialty Exam: Review of Systems  Psychiatric/Behavioral:  Positive for dysphoric mood.   All other systems reviewed and are negative.  unknown if currently breastfeeding.There is no height or weight on file to calculate BMI.  General Appearance: Casual  Eye Contact:  Good  Speech:  Normal Rate  Volume:  Normal  Mood:  Depression  Affect:  Congruent  Thought Process:  Coherent and Descriptions of Associations: Intact  Orientation:  Full (Time, Place, and Person)  Thought Content:  WDL  Suicidal Thoughts:  No  Homicidal Thoughts:  No  Memory:  Immediate;   Good Recent;   Good Remote;   Good  Judgement:  Good  Insight:  Good  Psychomotor Activity:  Decreased  Concentration:  Concentration: Good and Attention Span: Good  Recall:  Good  Fund of Knowledge:Good  Language: Good  Akathisia:  No  Handed:  Right  AIMS (if indicated):  not done  Assets:  Desire for Improvement Housing Leisure Time Physical Health Resilience Social Support  ADL's:  Intact  Cognition: WNL  Sleep:  Fair   Screenings: AUDIT    Advertising copywriter from 04/08/2021 in [facility] Counselor from 02/11/2021 in [facility]  Alcohol Use Disorder Identification Test Final Score (AUDIT) 24 23      GAD-7    Flowsheet Row Counselor from 05/06/2021 in [facility] Counselor from 04/08/2021 in [facility]  Total GAD-7 Score 6 7      PHQ2-9    Flowsheet Row Office Visit from 05/10/2021 in [facility] Counselor from 05/06/2021 in [facility] Counselor from 04/08/2021 in [facility] Counselor from 02/11/2021 in [facility] Office Visit from 08/10/2015 in Primary Care at [facility] Total Score 2 2 2 2  0  PHQ-9 Total Score 7 7 6 5  --      Flowsheet Row Office Visit from 05/10/2021 in [facility] ED from 04/17/2021 in [facility] Counselor from 02/11/2021 in [facility]  C-SSRS RISK CATEGORY No Risk No Risk No Risk       Assessment and Plan:  Bipolar affective disorder, depressed, moderate: -Changed Depakote 500 mg BID to Depakote ER 1000 mg daily -Continue Lexapro 10 mg daily -Continue therapy  General anxiety disorder: -continue therapy  Alcohol use disorder: -Refrain from alcohol and drug use -Attend 12-step program   Virtual Visit via Video Note  I connected with 02/13/2021 on 06/07/21 at  9:00 AM EST by a video enabled telemedicine application and verified that I am speaking with the correct person using two identifiers.  Location: Patient: home Provider: home office   I discussed the limitations of evaluation and management by telemedicine and the availability of in person appointments. The patient expressed understanding and agreed to proceed.  Follow Up Instructions: Follow up in 3 months   I discussed the assessment and treatment plan with the patient. The patient was provided an opportunity to ask questions and all were answered. The patient agreed with the plan and demonstrated an understanding of the instructions.   The patient was advised to call back or seek an in-person evaluation if the symptoms worsen or if the condition fails to improve as anticipated.  I provided 20 minutes of non-face-to-face time during this encounter.   Harless Nakayama, NP    06/09/21,  NP 11/14/20229:14 AM

## 2021-07-09 ENCOUNTER — Encounter (HOSPITAL_COMMUNITY): Payer: Self-pay

## 2021-07-15 ENCOUNTER — Ambulatory Visit (HOSPITAL_COMMUNITY): Payer: Medicaid Other | Admitting: Licensed Clinical Social Worker

## 2021-07-15 ENCOUNTER — Telehealth (HOSPITAL_COMMUNITY): Payer: Self-pay | Admitting: Licensed Clinical Social Worker

## 2021-07-15 ENCOUNTER — Encounter (HOSPITAL_COMMUNITY): Payer: Self-pay

## 2021-07-15 NOTE — Telephone Encounter (Signed)
LCSW sent two links to home phone with no response. LCSW called home phone number was not in service. LCSW sent 2 links to patient cell pone at 11:10 and 11:13 wit no response. LCSW f/u with PC to mobile phone at 11:11 and 11:15 with no response. Phone would ring 6 to 8 times then go silent for both PC, no VM left.

## 2021-08-12 ENCOUNTER — Encounter (HOSPITAL_COMMUNITY): Payer: Self-pay

## 2021-08-12 ENCOUNTER — Ambulatory Visit (HOSPITAL_COMMUNITY): Payer: Medicaid Other | Admitting: Licensed Clinical Social Worker

## 2021-09-06 ENCOUNTER — Encounter (HOSPITAL_COMMUNITY): Payer: Self-pay

## 2021-09-06 ENCOUNTER — Telehealth (HOSPITAL_COMMUNITY): Payer: Medicaid Other | Admitting: Psychiatry

## 2021-09-07 ENCOUNTER — Telehealth (HOSPITAL_COMMUNITY): Payer: Medicaid Other | Admitting: Psychiatry

## 2021-10-13 ENCOUNTER — Other Ambulatory Visit (HOSPITAL_COMMUNITY): Payer: Self-pay | Admitting: Psychiatry

## 2022-05-25 ENCOUNTER — Telehealth (HOSPITAL_COMMUNITY): Payer: Self-pay | Admitting: *Deleted

## 2022-05-25 NOTE — Telephone Encounter (Signed)
Fax request received for escitalopram but it was denied by this Probation officer as patient has not been seen since feb 2022 and has no future appts scheduled and no medication has been written since 2022

## 2022-06-29 LAB — OB RESULTS CONSOLE GC/CHLAMYDIA
Chlamydia: NEGATIVE
Neisseria Gonorrhea: NEGATIVE

## 2022-07-25 NOTE — L&D Delivery Note (Deleted)
Cesarean Section Procedure Note  Indications: chorioamnionitis and failure to progress: arrest of dilation  Pre-operative Diagnosis: 39 week 4 day pregnancy.  Post-operative Diagnosis: same  Surgeon: Jackie Plum   Assistants: Dr. Lucianne Muss, [facility] fellow  Anesthesia: Epidural anesthesia  ASA Class: 2   Procedure Details  CS OP NOTE Information for the patient's newborn:  Lue, Dunckel [[id number]]  @[id number] @   Indication and Consent:  Patient presented in active labor and she was admitted at [redacted]w[redacted]d. Patient progressed to 6 cm when admitted for active labor this morning at 0753. Patient has since made little to no changed after AROM at 1130 and initiation of pitocin. Patient contractions have continue to be inadequate and no cervical change in > 6 hours in the setting of AROM and pitocin. Patient also suspected to chorioamnionitis. Recommendation for primary cesarean section for arrest of active labor made. Risks discussed include bleeding, infection, injury to surrounding organs including but not limited to bowel, bladder, and ureters. Also discussed risk of hysterectomy in case of life-threatening emergency, and death. Pt verbalizes understanding and wishes to proceed. Informed written and verbal consent obtained  Procedures: The patient was taken to the operating room where anesthesia was found to be adequate and infection prophylaxis (Unasyn) previously given for suspected chorioamnionitis and adequate for c-section infection prophylaxis. Discussed with pharmacy. She was prepared and draped in the dorsal supine position with a leftward tilt. A Pfannenstiel skin incision was made with scalpel. The incision was carried down to the fascia with Bovie electrocautery. The fascia was incised and extended laterally. The superior aspect of the fascia was grasped with Kocher clamps and the rectus muscle was dissected off with mayo scissors. In a similar fashion, the inferior aspect of the  fascia was elevated and the underlying rectus muscle and pyramidalis were dissected off. Hemostasis was achieved with the bovie. The rectus muscle was separated in the midline down to the level of pubic symphysis. Pre-peritoneal fatty tissue was extended superiorly and inferiorly to the bladder reflection with good visualization of the bladder.   The Alexis-O retractor was inserted and vesicouterine peritoneum identified. Intraabdominal survey revealed scant, clear peritoneal fluid and a thin lower uterine segment. The vesicouterine peritoneum was opened with scissors and the bladder flap was developed. A low transverse uterine incision was made with a scalpel, the amniotic sac was ruptured and scant fluid noted. The uterine incision was extended bluntly with lateral and upward traction.   The fetus was in cephalic presentation. The head was elevated out of the pelvis with special attention paid to avoid using the uterine incision as a fulcrum. Gentle fundal pressure was applied once the head was brought into the incision. The infant was delivered with no difficulty. The mouth and nose were suctioned with a bulb. The cord was clamped and cut. The infant was handed off to the awaiting NICU staff for evaluation. The placenta was delivered intact with manual massage of uterine fundus. IV oxytocin was initiated to facilitated uterine contrations.  The inside of uterus was wiped clean. The uterine incision was closed first with 0 Vicryl in a locking pattern, second imbricating layer by 0 Monocryl  in a running pattern. The ovaries and tubes were found to be normal. The tubes and ovaries were then returned to the abdominal cavity. Blood clots and fluid were wiped out of the abdomen and pelvis with moist laparotomy sponges. The uterine incision was reinspected and good hemostasis was noted. Interceed applied over hysterotomy site.  The peritoneum  was closed with 2-0 Vicryl in a continuous pattern. The muscle layer  was reapproximated with 3-0 Vicryl in an interupted pattern. The fascial layer was closed with 0 Vicryl suture. Scarpa's fascia was reapproximated with 2-0 plain gut suture in a continuous fashion. The skin was closed with 4-0 monocryl subcuticularly.The patient tolerated the procedure well. All the counts were correct times three. The patient was taken to the recovery room in a stable condition. Dr. Hester Mates was present for the entire procedure and Dr. Lucianne Muss [facility] fellow) assisted.     Findings: Live female infant, Apgars 6 and 9 at 1 and 5 minutes respectively  Estimated Blood Loss:   889 mL         Urine: 200 mL         Total IV Fluids:  1000 mL         Specimens: Placenta and Disposition:  Sent to Pathology         Complications:  None; patient tolerated the procedure well.         Disposition: PACU - hemodynamically stable.         Condition: stable  Attending Attestation: I was present and scrubbed and the assistant was required due to complexity of anatomy. Due to the complexity of the case, Dr. Lucianne Muss (OB fellow)  was asked to serve as surgeon assist. The assistant surgeon aided in safe handling of the pelvic structures, associated retraction, and exposure of the operative field.   Dr. Reesa Chew

## 2022-08-12 LAB — OB RESULTS CONSOLE HIV ANTIBODY (ROUTINE TESTING): HIV: NONREACTIVE

## 2022-08-12 LAB — OB RESULTS CONSOLE RUBELLA ANTIBODY, IGM: Rubella: IMMUNE

## 2022-08-12 LAB — OB RESULTS CONSOLE RPR: RPR: NONREACTIVE

## 2022-08-12 LAB — OB RESULTS CONSOLE HEPATITIS B SURFACE ANTIGEN: Hepatitis B Surface Ag: NEGATIVE

## 2022-08-12 LAB — HEPATITIS C ANTIBODY: HCV Ab: NEGATIVE

## 2022-08-17 ENCOUNTER — Other Ambulatory Visit: Payer: Self-pay

## 2022-09-15 ENCOUNTER — Other Ambulatory Visit: Payer: Self-pay

## 2022-10-10 ENCOUNTER — Other Ambulatory Visit: Payer: Self-pay | Admitting: Obstetrics & Gynecology

## 2022-10-10 DIAGNOSIS — Z363 Encounter for antenatal screening for malformations: Secondary | ICD-10-CM

## 2022-10-12 ENCOUNTER — Encounter: Payer: Self-pay | Admitting: Genetics

## 2022-10-25 ENCOUNTER — Other Ambulatory Visit: Payer: Self-pay

## 2022-10-26 ENCOUNTER — Ambulatory Visit: Payer: Medicaid Other | Attending: Obstetrics & Gynecology

## 2022-10-26 DIAGNOSIS — D582 Other hemoglobinopathies: Secondary | ICD-10-CM

## 2022-10-26 NOTE — Progress Notes (Signed)
[facility] at [facility] [address], [address] Phone:  [phone]   Fax:  [phone]    Name: Sally Cardenas: Maternal Hemoglobin E Trait   DOB: 12-Dec-1990 Age: 32 y.o.   EDD: 03/19/2023 LMP: 06/14/2022 Referring Provider:  Waymon Amato, MD  EGA: [redacted]w[redacted]d Genetic Counselor: Sally Righter, MS, CGC  OB HxEP:[id number] Date of Appointment: 10/26/2022  Accompanied by: Sally Cardenas Telehealth Time: 40 Minutes   Previous Testing Completed: Sally Cardenas previously completed cell-free DNA screening (cfDNA) in this pregnancy. The result is low risk. This screening significantly reduces the risk that the current pregnancy has Down syndrome, Trisomy 28, Trisomy 49, and common sex chromosome conditions, however, the risk is not zero given the limitations of cfDNA. Additionally, there are many genetic conditions that cannot be detected by cfDNA.  Sally Cardenas previously completed a maternal serum AFP screen in this pregnancy. The result is screen negative. A negative result reduces the risk that the current pregnancy has an open neural tube defect. Closed neural tube defects and some open defects may not be detected by this screen.    Medical History:  This is Sally Cardenas's 3rd pregnancy. She has had 2 early losses, one of which was elective. Reports she takes lexapro, cetirizine, albuterol sulfate, prenatal vitamins, acetaminophen, vitamin D3, Unisom, and fluticasone.  Denies personal history of diabetes, high blood pressure, thyroid conditions, and seizures. Denies bleeding, infections, and fevers in this pregnancy. Denies using tobacco, alcohol, or street drugs in this pregnancy.   Family History: A pedigree was created and scanned into Epic under the Media tab. Sally Cardenas reports her father has myelodysplastic syndrome.  Sally Cardenas reported to genetic counseling that a few members of the couple's distant family have autism. Maternal ethnicity reported  as [place] and paternal ethnicity reported as Surveyor, quantity. Denies Ashkenazi Jewish ancestry.     Genetic Counseling:   Maternal Hemoglobin E Trait. Genetic counseling reviewed with Sally Cardenas that hemoglobin is the part of the red blood cells that carries oxygen to tissues throughout the body. 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 Carriers of Hemoglobin E trait usually do not have symptoms, although some will have very mild anemia that does not typically need treatment. Given Jaquasha's carrier status, we discussed the natural history and autosomal recessive inheritance pattern associated with Beta-Hemoglobinopathies. We additionally reviewed that Sally Cardenas would have an increased risk to have children affected with a Beta-Hemoglobinopathy if her reproductive partner, Sally Cardenas, is also found to be a carrier of a Beta-Hemoglobinopathy.  If Sally Cardenas is found to also be a carrier for Hemoglobin E trait (Hb A/E), there would be a 25% risk for their offspring together to be affected with Hemoglobin E Disease (Hb E/E). People with Hemoglobin E Disease usually do not have symptoms other than sometimes having very mild anemia, and, more rarely, having a slightly enlarged spleen. People with Hemoglobin E Disease rarely need treatment.  If Sally Cardenas is found to be a carrier for Sickle Cell Disease (Hb A/S), there would be a 25% risk for their offspring together to be affected with Hemoglobin SE Disease (Hb S/E), which is a mild form of Sickle Cell Disease. In Hemoglobin SE Disease, red blood cells have an abnormal sickle shape which leads to mild to moderate anemia. Symptoms are usually mild in childhood but may become more serious starting in  adulthood. Some people with Hemoglobin SE Disease have episodes of pain when sickled red blood cells  get stuck in small blood vessels; this occurs more often during pregnancy. Other symptoms of Hemoglobin SE Disease may include jaundice, enlarged spleen, increased infections, and/or damage to other organs.  If Sally Cardenas is found to be a carrier of Beta-Thalassemia, there would be a 25% risk for their offspring together to be affected with Hemoglobin E/Beta Thalassemia (Hb E/?+ or Hb E/?). Individuals with Hemoglobin E/Beta Thalassemia typically have moderate to severe anemia that usually requires ongoing medical care which sometimes includes repeated blood transfusions. Additional symptoms may include slow growth and development, jaundice, bone changes, and an enlarged spleen, liver, and/or heart.  Given Sally Cardenas's hemoglobin fractionation cascade result, genetic counseling recommended screening Sally Cardenas for Beta Hemoglobinopathies. If both members of a couple are known to be carriers, diagnostic testing is available to determine if the pregnancy is affected. If diagnostic testing via amniocentesis is not desired, Beta Hemoglobinopathy testing can be completed after birth and is included in [place].  Maternal Carrier screening. Per the ACOG Committee Opinion 691, all women who are considering a pregnancy or are currently pregnant should be offered carrier screening for, at minimum, Cystic Fibrosis (CF), Spinal Muscular Atrophy (SMA), and Hemoglobinopathies. To genetic counseling's knowledge, Sally Cardenas has not completed carrier screening for CF, SMA, and Alpha Thalassemia (a hemoglobinopathy). The mode of inheritance, clinical manifestations of these conditions, as well as details about testing were reviewed. A negative result on carrier screening reduces the likelihood of being a carrier, however, does not entirely rule out the possibility. If Sally Cardenas was found to be a carrier for a specific condition, carrier screening for Sally Cardenas for that condition would be recommended.    Family history of Autism Spectrum Disorder. Sally Cardenas reported to genetic counseling that a few members of the couple's distant family have autism. Autism Spectrum Disorder affects approximately 1-2% of the general population in the [place], [place], and [place]. Autism is a neurological and developmental disorder that affects how people interact with others, communicate, learn, and behave. Autism is known as a "spectrum" disorder because there is wide variation in the type and severity of symptoms people experience. Genetic testing for individuals with a clinical diagnosis of Autism yields an explanation in only about 20% of cases, and the remaining 80% of cases are left with unknown etiology. We are unable to test directly for Autism in pregnancy. Given the distant relations of the individuals with Autism to the current pregnancy, the risk for the current pregnancy to also have Autism is not likely to be increased above the general population risk.     Patient Plan:  Given that Sally Cardenas has not had carrier screening for all of the conditions recommended by ACOG, she opted [facility] to have her blood drawn for this Cardenas. We will order Sally Cardenas which includes Cystic Fibrosis, Spinal Muscular Atrophy, Alpha Thalassemia, and Beta Hemoglobinopathies. The Beta Hemoglobinopathies portion of the carrier screen should confirm the variant in Sally Cardenas's HBB gene that is causing her Hemoglobin E Trait.   Sally Cardenas reported to genetic counseling that Sally Cardenas is currently living in [place], [place]. We offered to order a hemoglobin fractionation cascade (for Beta-Hemoglobinopathies) through [facility] so that Sally Cardenas could complete screening at a [facility] draw site close to where he is living as soon as possible. If Sally Cardenas completed a hemoglobin fractionation cascade we would  have results within a few days and could more accurately  discuss risk for the current pregnancy to be affected with a Beta Hemoglobinopathy. We also discussed that Sally Cardenas could complete carrier screening for Beta Hemoglobinopathies through [place] via a saliva kit sent to his house in [facility] or via a blood draw when he attends Sally Cardenas ultrasound in the [facility] on Sally Cardenas. Results from [facility] carrier screening (via saliva and blood) typically take approximately 3 weeks. We discussed that by the time of Sally Cardenas's ultrasound appointment at the end of Sally we should have her Sally Cardenas carrier screening Cardenas back from [place] and we would know if she is a carrier for more than just Hemoglobin E Trait. If she is a carrier for more than just Hemoglobin E trait, carrier screening for Sally Cardenas would be ordered through [place] for Hemoglobin E trait and any other condition Annistyn screens positive for. If Magdalina is only a carrier for Hemoglobin E trait, meaning the [employer] carrier screen does not identify any new information, we will order a hemoglobin fractionation cascade for Sally Cardenas through [facility] Pollock agreed to the plan documented above. If Momoko opts to have Sally Cardenas screened for Beta-Hemoglobinopathies before her ultrasound appointment at the end of Sally, she should contact genetic counseling in the [facility] as soon as possible so that we can get this order placed for Sally Cardenas.   Informed consent was obtained. All questions were answered.    Thank you for sharing in the care of [place] with [place].  Please do not hesitate to contact us if you have any questions.  Sally Righter, MS, [place] Certified Genetic Counselor  Genetic counseling student involved in appointment: Yes (S.B.)

## 2022-10-28 ENCOUNTER — Ambulatory Visit: Payer: Medicaid Other | Attending: Obstetrics and Gynecology

## 2022-10-28 DIAGNOSIS — Z31430 Encounter of female for testing for genetic disease carrier status for procreative management: Secondary | ICD-10-CM

## 2022-11-05 LAB — HORIZON CUSTOM: REPORT SUMMARY: POSITIVE — AB

## 2022-11-07 ENCOUNTER — Telehealth: Payer: Self-pay | Admitting: Obstetrics and Gynecology

## 2022-11-07 NOTE — Telephone Encounter (Signed)
Phone call to patient with results of [facility] carrier screening.  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  The results were negative for CF, SMA and alpha thalassemia. See report for details.  Her partner may come with her to the ultrasound visit on 11/21/22 and have testing for hemoglobinopathies if desired at that time.  She is aware that newborn screening can also be used to assess for Beta hemoglobinopathies.  Cherly Anderson, MS, CGC

## 2022-11-17 ENCOUNTER — Encounter: Payer: Self-pay | Admitting: *Deleted

## 2022-11-21 ENCOUNTER — Other Ambulatory Visit: Payer: Self-pay | Admitting: Obstetrics & Gynecology

## 2022-11-21 ENCOUNTER — Encounter: Payer: Self-pay | Admitting: *Deleted

## 2022-11-21 ENCOUNTER — Ambulatory Visit: Payer: Medicaid Other | Attending: Obstetrics & Gynecology

## 2022-11-21 ENCOUNTER — Telehealth: Payer: Self-pay | Admitting: Obstetrics and Gynecology

## 2022-11-21 ENCOUNTER — Other Ambulatory Visit: Payer: Self-pay | Admitting: *Deleted

## 2022-11-21 ENCOUNTER — Ambulatory Visit: Payer: Medicaid Other | Admitting: *Deleted

## 2022-11-21 DIAGNOSIS — O283 Abnormal ultrasonic finding on antenatal screening of mother: Secondary | ICD-10-CM

## 2022-11-21 DIAGNOSIS — Z363 Encounter for antenatal screening for malformations: Secondary | ICD-10-CM | POA: Insufficient documentation

## 2022-11-21 DIAGNOSIS — Z362 Encounter for other antenatal screening follow-up: Secondary | ICD-10-CM

## 2022-11-23 ENCOUNTER — Telehealth: Payer: Self-pay | Admitting: Obstetrics and Gynecology

## 2022-11-23 NOTE — Telephone Encounter (Signed)
PC to patient - left message regarding FOB testing.

## 2022-11-23 NOTE — Telephone Encounter (Signed)
Phone call to Sally Cardenas to follow up on option of carrier screening for her partner for hemoglobinopathies (see prior notes). She initially indicated that he may elect to have blood drawn at her ultrasound visit on 11/21/22 but he did not.  In our conversation today, she states that they decline to have him tested at this time and will follow up after birth if desired.  Cherly Anderson, MS, CGC

## 2022-12-22 ENCOUNTER — Encounter: Payer: Self-pay | Admitting: *Deleted

## 2022-12-22 DIAGNOSIS — Z862 Personal history of diseases of the blood and blood-forming organs and certain disorders involving the immune mechanism: Secondary | ICD-10-CM | POA: Insufficient documentation

## 2022-12-26 ENCOUNTER — Ambulatory Visit: Payer: Medicaid Other | Admitting: *Deleted

## 2022-12-26 ENCOUNTER — Ambulatory Visit: Payer: Medicaid Other | Attending: Obstetrics

## 2022-12-26 ENCOUNTER — Other Ambulatory Visit: Payer: Self-pay | Admitting: *Deleted

## 2022-12-26 VITALS — BP 134/77 | HR 101

## 2022-12-26 DIAGNOSIS — O99513 Diseases of the respiratory system complicating pregnancy, third trimester: Secondary | ICD-10-CM

## 2022-12-26 DIAGNOSIS — O283 Abnormal ultrasonic finding on antenatal screening of mother: Secondary | ICD-10-CM | POA: Diagnosis present

## 2022-12-26 DIAGNOSIS — O3443 Maternal care for other abnormalities of cervix, third trimester: Secondary | ICD-10-CM | POA: Diagnosis not present

## 2022-12-26 DIAGNOSIS — D573 Sickle-cell trait: Secondary | ICD-10-CM

## 2022-12-26 DIAGNOSIS — O99343 Other mental disorders complicating pregnancy, third trimester: Secondary | ICD-10-CM

## 2022-12-26 DIAGNOSIS — Z362 Encounter for other antenatal screening follow-up: Secondary | ICD-10-CM | POA: Diagnosis present

## 2022-12-26 DIAGNOSIS — Z3A28 28 weeks gestation of pregnancy: Secondary | ICD-10-CM

## 2022-12-26 DIAGNOSIS — O285 Abnormal chromosomal and genetic finding on antenatal screening of mother: Secondary | ICD-10-CM

## 2022-12-26 DIAGNOSIS — F319 Bipolar disorder, unspecified: Secondary | ICD-10-CM | POA: Diagnosis not present

## 2022-12-26 DIAGNOSIS — O99333 Smoking (tobacco) complicating pregnancy, third trimester: Secondary | ICD-10-CM

## 2022-12-26 DIAGNOSIS — J45909 Unspecified asthma, uncomplicated: Secondary | ICD-10-CM

## 2022-12-26 DIAGNOSIS — Z720 Tobacco use: Secondary | ICD-10-CM

## 2022-12-26 DIAGNOSIS — Z9889 Other specified postprocedural states: Secondary | ICD-10-CM

## 2022-12-26 DIAGNOSIS — Z331 Pregnant state, incidental: Secondary | ICD-10-CM

## 2023-02-06 ENCOUNTER — Encounter (HOSPITAL_COMMUNITY): Payer: Self-pay | Admitting: Obstetrics and Gynecology

## 2023-02-06 ENCOUNTER — Encounter: Payer: Self-pay | Admitting: *Deleted

## 2023-02-06 ENCOUNTER — Other Ambulatory Visit: Payer: Self-pay | Admitting: Obstetrics and Gynecology

## 2023-02-06 ENCOUNTER — Ambulatory Visit: Payer: Medicaid Other | Admitting: *Deleted

## 2023-02-06 ENCOUNTER — Ambulatory Visit: Payer: Medicaid Other | Attending: Obstetrics and Gynecology

## 2023-02-06 ENCOUNTER — Inpatient Hospital Stay (HOSPITAL_COMMUNITY)
Admission: AD | Admit: 2023-02-06 | Discharge: 2023-02-06 | Disposition: A | Discharge status: Home / Self Care | Payer: Medicaid Other | Attending: Obstetrics and Gynecology | Admitting: Obstetrics and Gynecology

## 2023-02-06 DIAGNOSIS — Z3A34 34 weeks gestation of pregnancy: Secondary | ICD-10-CM | POA: Diagnosis not present

## 2023-02-06 DIAGNOSIS — Z3689 Encounter for other specified antenatal screening: Secondary | ICD-10-CM | POA: Insufficient documentation

## 2023-02-06 DIAGNOSIS — J45909 Unspecified asthma, uncomplicated: Secondary | ICD-10-CM

## 2023-02-06 DIAGNOSIS — D573 Sickle-cell trait: Secondary | ICD-10-CM | POA: Diagnosis not present

## 2023-02-06 DIAGNOSIS — Z87891 Personal history of nicotine dependence: Secondary | ICD-10-CM

## 2023-02-06 DIAGNOSIS — O99013 Anemia complicating pregnancy, third trimester: Secondary | ICD-10-CM | POA: Diagnosis not present

## 2023-02-06 DIAGNOSIS — Z331 Pregnant state, incidental: Secondary | ICD-10-CM

## 2023-02-06 DIAGNOSIS — O99513 Diseases of the respiratory system complicating pregnancy, third trimester: Secondary | ICD-10-CM

## 2023-02-06 DIAGNOSIS — Z720 Tobacco use: Secondary | ICD-10-CM | POA: Diagnosis present

## 2023-02-06 DIAGNOSIS — O368330 Maternal care for abnormalities of the fetal heart rate or rhythm, third trimester, not applicable or unspecified: Secondary | ICD-10-CM | POA: Diagnosis not present

## 2023-02-06 DIAGNOSIS — O3443 Maternal care for other abnormalities of cervix, third trimester: Secondary | ICD-10-CM | POA: Diagnosis not present

## 2023-02-06 DIAGNOSIS — O368390 Maternal care for abnormalities of the fetal heart rate or rhythm, unspecified trimester, not applicable or unspecified: Secondary | ICD-10-CM | POA: Diagnosis present

## 2023-02-06 NOTE — MAU Note (Addendum)
History    Chief Complaint  Patient presents with   for monitoring   Sunny Aguon is a 32 year old female G3P020 at [redacted]w[redacted]d gestation w/ PMHx of environmental allergies, asthma, bipolar disorder, depression who was sent from MFM for 1 hour NST due to fetal tachyarrhythmia noted on ultrasound today. The patient says that she feels well and that her fetus' heart rate rose to 158 and had an abnormal rhythm for which she needed the fetus to be evaluated. She denies chest pain, shortness of breath, headache, leg swelling, abdominal pain, vaginal bleeding, or leakage of fluid. The patient has not used her albuterol, consumed caffeine, or used any recreational drugs. She has not been ill recently.   Past Medical History:  Diagnosis Date   Alcohol dependence (HCC) 06/07/2021   Allergy    Anxiety    Asthma    Bipolar affective disorder, depressed, moderate (HCC) 05/10/2021   Depression    Vaginal bleeding 09/26/2016    Past Surgical History:  Procedure Laterality Date   CERVICAL BIOPSY  W/ LOOP ELECTRODE EXCISION     DILATION AND EVACUATION N/A 04/24/2018   Procedure: DILATATION AND EVACUATION;  Surgeon: Hoover Browns, MD;  Location: [facility];  Service: Gynecology;  Laterality: N/A;    Family History  Problem Relation Age of Onset   Hyperlipidemia Mother    Hypertension Mother    Cancer Father     Social History   Tobacco Use   Smoking status: Former    Current packs/day: 0.00    Types: Cigarettes, E-cigarettes    Quit date: 07/10/2015    Years since quitting: 7.5   Smokeless tobacco: Never  Vaping Use   Vaping status: Former  Substance Use Topics   Alcohol use: Not Currently    Alcohol/week: 8.0 standard drinks of alcohol    Types: 8 Standard drinks or equivalent per week    Comment: hard seltzer   Drug use: Yes    Types: Marijuana, Cocaine    Comment: States several years ago    Allergies: No Known Allergies  Medications Prior to Admission  Medication Sig Dispense Refill  Last Dose   acetaminophen (TYLENOL) 500 MG tablet Take 1,000 mg by mouth daily as needed for moderate pain or headache.      albuterol (PROVENTIL HFA;VENTOLIN HFA) 108 (90 Base) MCG/ACT inhaler Inhale 2 puffs into the lungs every 6 (six) hours as needed for wheezing or shortness of breath.       cetirizine (ZYRTEC) 10 MG tablet Take 10 mg by mouth daily as needed for allergies.      cholecalciferol (VITAMIN D3) 25 MCG (1000 UNIT) tablet Take 1,000 Units by mouth daily.      divalproex (DEPAKOTE) 500 MG DR tablet TAKE 2 TABLETS(1000 MG) BY MOUTH DAILY 60 tablet 2    escitalopram (LEXAPRO) 10 MG tablet Take 1 tablet (10 mg total) by mouth daily. 30 tablet 2    fluticasone (FLONASE) 50 MCG/ACT nasal spray Place 1 spray into both nostrils daily as needed for allergies or rhinitis.      ibuprofen (ADVIL) 800 MG tablet Take 1 tablet (800 mg total) by mouth every 8 (eight) hours as needed for moderate pain. 21 tablet 0    montelukast (SINGULAIR) 10 MG tablet Take 10 mg by mouth daily.       Prenatal Vit-Fe Fumarate-FA (MULTIVITAMIN-PRENATAL) 27-0.8 MG TABS tablet Take 1 tablet by mouth daily at 12 noon.      valACYclovir (VALTREX) 500 MG tablet  Take 500 mg by mouth 2 (two) times daily as needed (fever blisters).  (Patient not taking: Reported on 12/26/2022)  1     Physical Exam Blood pressure 122/80, pulse (!) 107, temperature 97.9 F (36.6 C), temperature source Oral, resp. rate 17, height 4\' 11"  (1.499 m), weight 83.3 kg, last menstrual period 06/12/2022, SpO2 99%, unknown if currently breastfeeding. Physical Exam Constitutional:      General: She is not in acute distress.    Appearance: Normal appearance. She is normal weight. She is not ill-appearing.  HENT:     Head: Normocephalic.  Cardiovascular:     Rate and Rhythm: Tachycardia present.     Heart sounds: No murmur heard. Pulmonary:     Effort: Pulmonary effort is normal.     Breath sounds: Normal breath sounds.  Abdominal:      Tenderness: There is no abdominal tenderness.     Comments: gravid  Skin:    General: Skin is warm and dry.     Coloration: Skin is not pale.  Neurological:     General: No focal deficit present.     Mental Status: She is alert and oriented to person, place, and time. Mental status is at baseline.  Psychiatric:        Mood and Affect: Mood normal.        Behavior: Behavior normal.        Thought Content: Thought content normal.        Judgment: Judgment normal.    MAU Course Procedures  MDM Iyanna Drummer is a 32 year old female G3P020 at [redacted]w[redacted]d gestation w/ PMHx of environmental allergies, asthma, bipolar disorder, depression who was sent from MFM for 1 hour NST due to fetal tachyarrhythmia noted on ultrasound today. She feels well at this time. Fetal Heart Tracing is category one: 135 bpm, moderate variability, accels present, no decels present.   Fetal Tachyarrhythmia on [place] NST normal, no medications to attribute fetal heart rate elevation to. -Discharge, follow up with OB in 1 week.   Attestation of Supervision of Student:  I confirm that I have verified the information documented in the medical student's note and that I have also personally performed the history, physical exam and all medical decision making activities.  I have verified that all services and findings are accurately documented in this student's note; and I agree with management and plan as outlined in the documentation. I have also made any necessary editorial changes.  Patient sent here for concerns of fetal arrythmia. Had BPP 6/8. Sent here for prolonged monitoring. No other complaints.  BP 111/78   Pulse 92   Temp 97.9 F (36.6 C) (Oral)   Resp 17   Ht 4\' 11"  (1.499 m)   Wt 83.3 kg   LMP 06/12/2022 (Exact Date)   SpO2 99%   BMI 37.10 kg/m  A&Ox3, NAD. Abd soft, nontender. No edema.  NST:  Baseline: 130  Variability: moderate Accelerations: present  Decelerations: none Contractions: none  1.  [redacted] weeks gestation of pregnancy   2. NST (non-stress test) reactive    Discharge to home.   Levie Heritage, DO [facility] [employer], [facility] 02/06/2023 6:44 PM

## 2023-02-06 NOTE — MAU Note (Signed)
Sally Cardenas is a 32 y.o. at [redacted]w[redacted]d here in MAU reporting: sent from MFM, (BPP 6/8 ?arrythmia).wanting further monitoring on the baby's heart rate.  No bleeding or leaking. Reports +FM.   Onset of complaint: today Pain score: none Vitals:   02/06/23 1732  BP: 122/80  Pulse: (!) 107  Resp: 17  Temp: 97.9 F (36.6 C)  SpO2: 99%     HQI:ONGEX on Lab orders placed from triage:

## 2023-02-07 ENCOUNTER — Other Ambulatory Visit: Payer: Self-pay

## 2023-02-07 DIAGNOSIS — O368390 Maternal care for abnormalities of the fetal heart rate or rhythm, unspecified trimester, not applicable or unspecified: Secondary | ICD-10-CM

## 2023-02-07 DIAGNOSIS — Z720 Tobacco use: Secondary | ICD-10-CM

## 2023-02-13 ENCOUNTER — Ambulatory Visit: Payer: Medicaid Other | Attending: Maternal & Fetal Medicine

## 2023-02-13 DIAGNOSIS — O99013 Anemia complicating pregnancy, third trimester: Secondary | ICD-10-CM

## 2023-02-13 DIAGNOSIS — F17290 Nicotine dependence, other tobacco product, uncomplicated: Secondary | ICD-10-CM

## 2023-02-13 DIAGNOSIS — Z720 Tobacco use: Secondary | ICD-10-CM | POA: Diagnosis not present

## 2023-02-13 DIAGNOSIS — J45909 Unspecified asthma, uncomplicated: Secondary | ICD-10-CM

## 2023-02-13 DIAGNOSIS — O99333 Smoking (tobacco) complicating pregnancy, third trimester: Secondary | ICD-10-CM

## 2023-02-13 DIAGNOSIS — D573 Sickle-cell trait: Secondary | ICD-10-CM

## 2023-02-13 DIAGNOSIS — O285 Abnormal chromosomal and genetic finding on antenatal screening of mother: Secondary | ICD-10-CM

## 2023-02-13 DIAGNOSIS — O368390 Maternal care for abnormalities of the fetal heart rate or rhythm, unspecified trimester, not applicable or unspecified: Secondary | ICD-10-CM | POA: Diagnosis present

## 2023-02-13 DIAGNOSIS — O3443 Maternal care for other abnormalities of cervix, third trimester: Secondary | ICD-10-CM

## 2023-02-13 DIAGNOSIS — Z148 Genetic carrier of other disease: Secondary | ICD-10-CM

## 2023-02-13 DIAGNOSIS — Z3A35 35 weeks gestation of pregnancy: Secondary | ICD-10-CM

## 2023-02-13 DIAGNOSIS — O99513 Diseases of the respiratory system complicating pregnancy, third trimester: Secondary | ICD-10-CM

## 2023-02-20 ENCOUNTER — Ambulatory Visit: Payer: Medicaid Other | Attending: Obstetrics and Gynecology | Admitting: *Deleted

## 2023-02-20 DIAGNOSIS — O368330 Maternal care for abnormalities of the fetal heart rate or rhythm, third trimester, not applicable or unspecified: Secondary | ICD-10-CM | POA: Diagnosis present

## 2023-02-20 DIAGNOSIS — O368390 Maternal care for abnormalities of the fetal heart rate or rhythm, unspecified trimester, not applicable or unspecified: Secondary | ICD-10-CM

## 2023-02-20 DIAGNOSIS — Z3A36 36 weeks gestation of pregnancy: Secondary | ICD-10-CM | POA: Diagnosis not present

## 2023-02-20 NOTE — Procedures (Signed)
Sally Cardenas June 03, 1991 110w1d  Fetus A Non-Stress Test Interpretation for 02/20/23  Indication:  Fetal arrhythmia  Fetal Heart Rate A Mode: External Baseline Rate (A): 140 bpm Variability: Moderate Accelerations: 15 x 15 Decelerations: None Multiple birth?: No  Uterine Activity Mode: Toco Contraction Frequency (min): 2 UC's Contraction Duration (sec): 90 Contraction Quality: Mild Resting Tone Palpated: Relaxed Resting Time: Adequate  Interpretation (Fetal Testing) Nonstress Test Interpretation: Reactive Overall Impression: Reassuring for gestational age Comments: Tracing reviewed by Dr. Judeth Cornfield

## 2023-02-21 LAB — OB RESULTS CONSOLE GBS: GBS: NEGATIVE

## 2023-02-21 LAB — OB RESULTS CONSOLE GC/CHLAMYDIA
Chlamydia: NEGATIVE
Neisseria Gonorrhea: NEGATIVE

## 2023-02-27 ENCOUNTER — Ambulatory Visit: Payer: Medicaid Other | Attending: Maternal & Fetal Medicine

## 2023-02-27 DIAGNOSIS — O99343 Other mental disorders complicating pregnancy, third trimester: Secondary | ICD-10-CM

## 2023-02-27 DIAGNOSIS — O368390 Maternal care for abnormalities of the fetal heart rate or rhythm, unspecified trimester, not applicable or unspecified: Secondary | ICD-10-CM | POA: Diagnosis present

## 2023-02-27 DIAGNOSIS — F17290 Nicotine dependence, other tobacco product, uncomplicated: Secondary | ICD-10-CM | POA: Diagnosis not present

## 2023-02-27 DIAGNOSIS — J45909 Unspecified asthma, uncomplicated: Secondary | ICD-10-CM

## 2023-02-27 DIAGNOSIS — O368330 Maternal care for abnormalities of the fetal heart rate or rhythm, third trimester, not applicable or unspecified: Secondary | ICD-10-CM | POA: Diagnosis not present

## 2023-02-27 DIAGNOSIS — O99333 Smoking (tobacco) complicating pregnancy, third trimester: Secondary | ICD-10-CM

## 2023-02-27 DIAGNOSIS — O285 Abnormal chromosomal and genetic finding on antenatal screening of mother: Secondary | ICD-10-CM

## 2023-02-27 DIAGNOSIS — Z720 Tobacco use: Secondary | ICD-10-CM | POA: Diagnosis not present

## 2023-02-27 DIAGNOSIS — O3443 Maternal care for other abnormalities of cervix, third trimester: Secondary | ICD-10-CM

## 2023-02-27 DIAGNOSIS — Z148 Genetic carrier of other disease: Secondary | ICD-10-CM

## 2023-02-27 DIAGNOSIS — F99 Mental disorder, not otherwise specified: Secondary | ICD-10-CM

## 2023-02-27 DIAGNOSIS — Z3A37 37 weeks gestation of pregnancy: Secondary | ICD-10-CM

## 2023-02-27 DIAGNOSIS — O99513 Diseases of the respiratory system complicating pregnancy, third trimester: Secondary | ICD-10-CM

## 2023-03-06 ENCOUNTER — Other Ambulatory Visit: Payer: Self-pay | Admitting: Maternal & Fetal Medicine

## 2023-03-06 ENCOUNTER — Ambulatory Visit: Payer: Medicaid Other | Attending: Maternal & Fetal Medicine

## 2023-03-06 ENCOUNTER — Ambulatory Visit: Payer: Medicaid Other | Admitting: *Deleted

## 2023-03-06 DIAGNOSIS — F17290 Nicotine dependence, other tobacco product, uncomplicated: Secondary | ICD-10-CM | POA: Diagnosis not present

## 2023-03-06 DIAGNOSIS — O368390 Maternal care for abnormalities of the fetal heart rate or rhythm, unspecified trimester, not applicable or unspecified: Secondary | ICD-10-CM

## 2023-03-06 DIAGNOSIS — O285 Abnormal chromosomal and genetic finding on antenatal screening of mother: Secondary | ICD-10-CM

## 2023-03-06 DIAGNOSIS — Z720 Tobacco use: Secondary | ICD-10-CM | POA: Insufficient documentation

## 2023-03-06 DIAGNOSIS — O99343 Other mental disorders complicating pregnancy, third trimester: Secondary | ICD-10-CM

## 2023-03-06 DIAGNOSIS — O99333 Smoking (tobacco) complicating pregnancy, third trimester: Secondary | ICD-10-CM

## 2023-03-06 DIAGNOSIS — Z148 Genetic carrier of other disease: Secondary | ICD-10-CM

## 2023-03-06 DIAGNOSIS — J45909 Unspecified asthma, uncomplicated: Secondary | ICD-10-CM

## 2023-03-06 DIAGNOSIS — O3443 Maternal care for other abnormalities of cervix, third trimester: Secondary | ICD-10-CM

## 2023-03-06 DIAGNOSIS — Z3A38 38 weeks gestation of pregnancy: Secondary | ICD-10-CM

## 2023-03-06 DIAGNOSIS — O368330 Maternal care for abnormalities of the fetal heart rate or rhythm, third trimester, not applicable or unspecified: Secondary | ICD-10-CM | POA: Diagnosis not present

## 2023-03-06 DIAGNOSIS — O99513 Diseases of the respiratory system complicating pregnancy, third trimester: Secondary | ICD-10-CM

## 2023-03-06 DIAGNOSIS — F99 Mental disorder, not otherwise specified: Secondary | ICD-10-CM

## 2023-03-06 NOTE — Procedures (Signed)
Sally Cardenas [birth year] [redacted]w[redacted]d  Fetus A Non-Stress Test Interpretation for 03/06/23  Indication: Unsatisfactory BPP   BPP with NST  Fetal Heart Rate A Mode: External Baseline Rate (A): 130 bpm Variability: Moderate Accelerations: 15 x 15 Decelerations: None Multiple birth?: No  Uterine Activity Mode: Palpation, Toco Contraction Frequency (min): none Resting Tone Palpated: Relaxed  Interpretation (Fetal Testing) Nonstress Test Interpretation: Reactive Comments: Dr. Parke Poisson reviewed tracing

## 2023-03-09 ENCOUNTER — Other Ambulatory Visit: Payer: Self-pay | Admitting: Obstetrics and Gynecology

## 2023-03-13 ENCOUNTER — Ambulatory Visit: Payer: Medicaid Other | Attending: Obstetrics | Admitting: *Deleted

## 2023-03-13 ENCOUNTER — Other Ambulatory Visit: Payer: Self-pay | Admitting: *Deleted

## 2023-03-13 VITALS — BP 125/80

## 2023-03-13 DIAGNOSIS — O368330 Maternal care for abnormalities of the fetal heart rate or rhythm, third trimester, not applicable or unspecified: Secondary | ICD-10-CM | POA: Insufficient documentation

## 2023-03-13 DIAGNOSIS — O368390 Maternal care for abnormalities of the fetal heart rate or rhythm, unspecified trimester, not applicable or unspecified: Secondary | ICD-10-CM

## 2023-03-13 DIAGNOSIS — Z3A39 39 weeks gestation of pregnancy: Secondary | ICD-10-CM | POA: Insufficient documentation

## 2023-03-13 NOTE — Procedures (Signed)
Sally Cardenas 12/11/1990 [redacted]w[redacted]d  Fetus A Non-Stress Test Interpretation for 03/13/23  NST only  Indication:  fetal arrhythmia  Fetal Heart Rate A Mode: External Baseline Rate (A): 140 bpm Variability: Moderate Accelerations: 15 x 15 Decelerations: None Multiple birth?: No  Uterine Activity Mode: Palpation, Toco Contraction Frequency (min): none Resting Tone Palpated: Relaxed  Interpretation (Fetal Testing) Nonstress Test Interpretation: Reactive Overall Impression: Reassuring for gestational age Comments: Dr. Darra Lis reviewed tracing

## 2023-03-16 ENCOUNTER — Encounter (HOSPITAL_COMMUNITY)
Admission: AD | Disposition: A | Discharge status: Home / Self Care | Payer: Self-pay | Source: Home / Self Care | Attending: Obstetrics and Gynecology

## 2023-03-16 ENCOUNTER — Inpatient Hospital Stay (HOSPITAL_COMMUNITY): Payer: Medicaid Other | Admitting: Anesthesiology

## 2023-03-16 ENCOUNTER — Inpatient Hospital Stay (HOSPITAL_COMMUNITY)
Admission: AD | Admit: 2023-03-16 | Discharge: 2023-03-19 | DRG: 786 | Disposition: A | Discharge status: Home / Self Care | Payer: Medicaid Other | Attending: Obstetrics and Gynecology | Admitting: Obstetrics and Gynecology

## 2023-03-16 ENCOUNTER — Encounter (HOSPITAL_COMMUNITY): Payer: Self-pay | Admitting: Obstetrics and Gynecology

## 2023-03-16 ENCOUNTER — Other Ambulatory Visit: Payer: Self-pay

## 2023-03-16 DIAGNOSIS — O26893 Other specified pregnancy related conditions, third trimester: Principal | ICD-10-CM | POA: Diagnosis present

## 2023-03-16 DIAGNOSIS — O411230 Chorioamnionitis, third trimester, not applicable or unspecified: Secondary | ICD-10-CM | POA: Diagnosis not present

## 2023-03-16 DIAGNOSIS — Z87891 Personal history of nicotine dependence: Secondary | ICD-10-CM | POA: Diagnosis not present

## 2023-03-16 DIAGNOSIS — O9902 Anemia complicating childbirth: Secondary | ICD-10-CM | POA: Diagnosis present

## 2023-03-16 DIAGNOSIS — A6000 Herpesviral infection of urogenital system, unspecified: Secondary | ICD-10-CM | POA: Diagnosis present

## 2023-03-16 DIAGNOSIS — D573 Sickle-cell trait: Secondary | ICD-10-CM | POA: Diagnosis present

## 2023-03-16 DIAGNOSIS — Z3A39 39 weeks gestation of pregnancy: Secondary | ICD-10-CM

## 2023-03-16 DIAGNOSIS — Z79899 Other long term (current) drug therapy: Secondary | ICD-10-CM | POA: Diagnosis not present

## 2023-03-16 DIAGNOSIS — F319 Bipolar disorder, unspecified: Secondary | ICD-10-CM | POA: Diagnosis present

## 2023-03-16 DIAGNOSIS — O99824 Streptococcus B carrier state complicating childbirth: Secondary | ICD-10-CM | POA: Diagnosis present

## 2023-03-16 DIAGNOSIS — O9832 Other infections with a predominantly sexual mode of transmission complicating childbirth: Secondary | ICD-10-CM | POA: Diagnosis present

## 2023-03-16 DIAGNOSIS — R030 Elevated blood-pressure reading, without diagnosis of hypertension: Secondary | ICD-10-CM | POA: Diagnosis present

## 2023-03-16 DIAGNOSIS — O99344 Other mental disorders complicating childbirth: Secondary | ICD-10-CM | POA: Diagnosis present

## 2023-03-16 LAB — CBC
HCT: 41.6 % (ref 36.0–46.0)
Hemoglobin: 13.9 g/dL (ref 12.0–15.0)
MCH: 25 pg — ABNORMAL LOW (ref 26.0–34.0)
MCHC: 33.4 g/dL (ref 30.0–36.0)
MCV: 74.8 fL — ABNORMAL LOW (ref 80.0–100.0)
Platelets: 199 10*3/uL (ref 150–400)
RBC: 5.56 MIL/uL — ABNORMAL HIGH (ref 3.87–5.11)
RDW: 14.7 % (ref 11.5–15.5)
WBC: 19 10*3/uL — ABNORMAL HIGH (ref 4.0–10.5)
nRBC: 0 % (ref 0.0–0.2)

## 2023-03-16 LAB — TYPE AND SCREEN
ABO/RH(D): O POS
Antibody Screen: NEGATIVE

## 2023-03-16 LAB — RPR: RPR Ser Ql: NONREACTIVE

## 2023-03-16 LAB — COMPREHENSIVE METABOLIC PANEL
ALT: 12 U/L (ref 0–44)
AST: 18 U/L (ref 15–41)
Albumin: 2.9 g/dL — ABNORMAL LOW (ref 3.5–5.0)
Alkaline Phosphatase: 138 U/L — ABNORMAL HIGH (ref 38–126)
Anion gap: 12 (ref 5–15)
BUN: 8 mg/dL (ref 6–20)
CO2: 21 mmol/L — ABNORMAL LOW (ref 22–32)
Calcium: 9 mg/dL (ref 8.9–10.3)
Chloride: 102 mmol/L (ref 98–111)
Creatinine, Ser: 0.73 mg/dL (ref 0.44–1.00)
GFR, Estimated: >60 mL/min (ref >60)
Glucose, Bld: 103 mg/dL — ABNORMAL HIGH (ref 70–99)
Potassium: 3.8 mmol/L (ref 3.5–5.1)
Sodium: 135 mmol/L (ref 135–145)
Total Bilirubin: 0.7 mg/dL (ref 0.3–1.2)
Total Protein: 6.4 g/dL — ABNORMAL LOW (ref 6.5–8.1)

## 2023-03-16 LAB — PROTEIN / CREATININE RATIO, URINE
Creatinine, Urine: 81 mg/dL
Protein Creatinine Ratio: 0.12 mg/mg{Cre} (ref 0.00–0.15)
Total Protein, Urine: 10 mg/dL

## 2023-03-16 SURGERY — Surgical Case
Anesthesia: Epidural

## 2023-03-16 MED ORDER — LACTATED RINGERS IV SOLN: 500.0000 mL | Freq: Once | INTRAVENOUS | Status: DC

## 2023-03-16 MED ORDER — MORPHINE SULFATE (PF) 0.5 MG/ML IJ SOLN
INTRAMUSCULAR | Status: DC | PRN
  Administered 2023-03-16: 3 mg via EPIDURAL

## 2023-03-16 MED ORDER — DIPHENHYDRAMINE HCL 50 MG/ML IJ SOLN: 12.5000 mg | INTRAMUSCULAR | Status: DC | PRN

## 2023-03-16 MED ORDER — SODIUM CHLORIDE 0.9 % IR SOLN
Status: DC | PRN
  Administered 2023-03-16: 1

## 2023-03-16 MED ORDER — OXYTOCIN BOLUS FROM INFUSION: 333.0000 mL | Freq: Once | INTRAVENOUS | Status: DC

## 2023-03-16 MED ORDER — ONDANSETRON HCL 4 MG/2ML IJ SOLN
INTRAMUSCULAR | Status: AC
  Filled 2023-03-16: qty 2

## 2023-03-16 MED ORDER — MEPERIDINE HCL 25 MG/ML IJ SOLN: 6.2500 mg | INTRAMUSCULAR | Status: DC | PRN

## 2023-03-16 MED ORDER — EPHEDRINE 5 MG/ML INJ
10.0000 mg | INTRAVENOUS | Status: DC | PRN
  Filled 2023-03-16: qty 5

## 2023-03-16 MED ORDER — OXYTOCIN-SODIUM CHLORIDE 30-0.9 UT/500ML-% IV SOLN
1.0000 m[IU]/min | INTRAVENOUS | Status: DC
  Administered 2023-03-16: 2 m[IU]/min via INTRAVENOUS

## 2023-03-16 MED ORDER — FENTANYL CITRATE (PF) 100 MCG/2ML IJ SOLN
INTRAMUSCULAR | Status: AC
  Filled 2023-03-16: qty 2

## 2023-03-16 MED ORDER — OXYTOCIN-SODIUM CHLORIDE 30-0.9 UT/500ML-% IV SOLN
INTRAVENOUS | Status: AC
  Filled 2023-03-16: qty 500

## 2023-03-16 MED ORDER — FENTANYL CITRATE (PF) 100 MCG/2ML IJ SOLN
50.0000 ug | INTRAMUSCULAR | Status: DC | PRN
  Administered 2023-03-16 (×3): 100 ug via INTRAVENOUS
  Filled 2023-03-16 (×3): qty 2

## 2023-03-16 MED ORDER — SCOPOLAMINE 1 MG/3DAYS TD PT72
MEDICATED_PATCH | TRANSDERMAL | Status: AC
  Filled 2023-03-16: qty 1

## 2023-03-16 MED ORDER — DIPHENHYDRAMINE HCL 25 MG PO CAPS: 25.0000 mg | ORAL_CAPSULE | ORAL | Status: DC | PRN

## 2023-03-16 MED ORDER — OXYCODONE HCL 5 MG/5ML PO SOLN: 5.0000 mg | Freq: Once | ORAL | Status: DC | PRN

## 2023-03-16 MED ORDER — FENTANYL-BUPIVACAINE-NACL 0.5-0.125-0.9 MG/250ML-% EP SOLN
12.0000 mL/h | EPIDURAL | Status: DC | PRN
  Administered 2023-03-16: 12 mL/h via EPIDURAL
  Filled 2023-03-16: qty 250

## 2023-03-16 MED ORDER — DEXAMETHASONE SODIUM PHOSPHATE 10 MG/ML IJ SOLN
INTRAMUSCULAR | Status: AC
  Filled 2023-03-16: qty 1

## 2023-03-16 MED ORDER — PHENYLEPHRINE HCL (PRESSORS) 10 MG/ML IV SOLN
INTRAVENOUS | Status: DC | PRN
Start: 2023-03-16 — End: 2023-03-16
  Administered 2023-03-16: 80 ug via INTRAVENOUS
  Administered 2023-03-16: 40 ug via INTRAVENOUS
  Administered 2023-03-16: 80 ug via INTRAVENOUS

## 2023-03-16 MED ORDER — SODIUM CHLORIDE 0.9 % IV SOLN
3.0000 g | Freq: Four times a day (QID) | INTRAVENOUS | Status: AC
  Administered 2023-03-16 – 2023-03-17 (×5): 3 g via INTRAVENOUS
  Filled 2023-03-16 (×8): qty 8

## 2023-03-16 MED ORDER — PHENYLEPHRINE 80 MCG/ML (10ML) SYRINGE FOR IV PUSH (FOR BLOOD PRESSURE SUPPORT)
80.0000 ug | PREFILLED_SYRINGE | INTRAVENOUS | Status: DC | PRN
  Filled 2023-03-16: qty 10

## 2023-03-16 MED ORDER — ONDANSETRON HCL 4 MG/2ML IJ SOLN
4.0000 mg | Freq: Four times a day (QID) | INTRAMUSCULAR | Status: DC | PRN
  Administered 2023-03-16: 4 mg via INTRAVENOUS
  Filled 2023-03-16: qty 2

## 2023-03-16 MED ORDER — SODIUM CHLORIDE 0.9 % IV SOLN: INTRAVENOUS | Status: DC | PRN

## 2023-03-16 MED ORDER — PHENYLEPHRINE 80 MCG/ML (10ML) SYRINGE FOR IV PUSH (FOR BLOOD PRESSURE SUPPORT)
PREFILLED_SYRINGE | INTRAVENOUS | Status: AC
  Filled 2023-03-16: qty 10

## 2023-03-16 MED ORDER — SCOPOLAMINE 1 MG/3DAYS TD PT72
1.0000 | MEDICATED_PATCH | Freq: Once | TRANSDERMAL | Status: DC
  Administered 2023-03-16: 1.5 mg via TRANSDERMAL

## 2023-03-16 MED ORDER — STERILE WATER FOR IRRIGATION IR SOLN
Status: DC | PRN
  Administered 2023-03-16: 1

## 2023-03-16 MED ORDER — LACTATED RINGERS IV SOLN: INTRAVENOUS | Status: DC | PRN

## 2023-03-16 MED ORDER — OXYCODONE HCL 5 MG PO TABS: 5.0000 mg | ORAL_TABLET | Freq: Once | ORAL | Status: DC | PRN

## 2023-03-16 MED ORDER — ONDANSETRON HCL 4 MG/2ML IJ SOLN: 4.0000 mg | Freq: Three times a day (TID) | INTRAMUSCULAR | Status: DC | PRN

## 2023-03-16 MED ORDER — HYDROMORPHONE HCL 1 MG/ML IJ SOLN: 0.2500 mg | INTRAMUSCULAR | Status: DC | PRN

## 2023-03-16 MED ORDER — KETOROLAC TROMETHAMINE 30 MG/ML IJ SOLN
30.0000 mg | Freq: Four times a day (QID) | INTRAMUSCULAR | Status: AC | PRN
  Administered 2023-03-16: 30 mg via INTRAMUSCULAR

## 2023-03-16 MED ORDER — SODIUM CHLORIDE 0.9 % IV SOLN
3.0000 g | Freq: Four times a day (QID) | INTRAVENOUS | Status: DC
  Filled 2023-03-16 (×3): qty 8

## 2023-03-16 MED ORDER — TERBUTALINE SULFATE 1 MG/ML IJ SOLN: 0.2500 mg | Freq: Once | INTRAMUSCULAR | Status: DC | PRN

## 2023-03-16 MED ORDER — KETOROLAC TROMETHAMINE 30 MG/ML IJ SOLN: 30.0000 mg | Freq: Four times a day (QID) | INTRAMUSCULAR | Status: AC | PRN

## 2023-03-16 MED ORDER — OXYCODONE-ACETAMINOPHEN 5-325 MG PO TABS: 2.0000 | ORAL_TABLET | ORAL | Status: DC | PRN

## 2023-03-16 MED ORDER — TRANEXAMIC ACID-NACL 1000-0.7 MG/100ML-% IV SOLN
INTRAVENOUS | Status: DC | PRN
  Administered 2023-03-16: 1000 mg via INTRAVENOUS

## 2023-03-16 MED ORDER — SODIUM CHLORIDE 0.9% FLUSH: 3.0000 mL | INTRAVENOUS | Status: DC | PRN

## 2023-03-16 MED ORDER — KETOROLAC TROMETHAMINE 30 MG/ML IJ SOLN: 30.0000 mg | Freq: Once | INTRAMUSCULAR | Status: AC | PRN

## 2023-03-16 MED ORDER — LACTATED RINGERS IV SOLN: INTRAVENOUS | Status: DC

## 2023-03-16 MED ORDER — LACTATED RINGERS IV SOLN: 500.0000 mL | INTRAVENOUS | Status: DC | PRN

## 2023-03-16 MED ORDER — OXYCODONE-ACETAMINOPHEN 5-325 MG PO TABS: 1.0000 | ORAL_TABLET | ORAL | Status: DC | PRN

## 2023-03-16 MED ORDER — LIDOCAINE-EPINEPHRINE (PF) 2 %-1:200000 IJ SOLN
INTRAMUSCULAR | Status: DC | PRN
  Administered 2023-03-16: 10 mL via EPIDURAL
  Administered 2023-03-16: 5 mL via EPIDURAL

## 2023-03-16 MED ORDER — LIDOCAINE HCL (PF) 1 % IJ SOLN: 30.0000 mL | INTRAMUSCULAR | Status: DC | PRN

## 2023-03-16 MED ORDER — FENTANYL CITRATE (PF) 100 MCG/2ML IJ SOLN
INTRAMUSCULAR | Status: DC | PRN
  Administered 2023-03-16: 100 ug via EPIDURAL

## 2023-03-16 MED ORDER — SOD CITRATE-CITRIC ACID 500-334 MG/5ML PO SOLN
30.0000 mL | ORAL | Status: DC | PRN
  Administered 2023-03-16: 30 mL via ORAL
  Filled 2023-03-16: qty 30

## 2023-03-16 MED ORDER — CLINDAMYCIN PHOSPHATE 900 MG/50ML IV SOLN: 900.0000 mg | Freq: Three times a day (TID) | INTRAVENOUS | Status: DC

## 2023-03-16 MED ORDER — CLINDAMYCIN PHOSPHATE 900 MG/50ML IV SOLN
INTRAVENOUS | Status: AC
  Filled 2023-03-16: qty 50

## 2023-03-16 MED ORDER — ACETAMINOPHEN 500 MG PO TABS
1000.0000 mg | ORAL_TABLET | Freq: Four times a day (QID) | ORAL | Status: DC | PRN
  Administered 2023-03-16: 1000 mg via ORAL

## 2023-03-16 MED ORDER — ONDANSETRON HCL 4 MG/2ML IJ SOLN
INTRAMUSCULAR | Status: DC | PRN
  Administered 2023-03-16: 4 mg via INTRAVENOUS

## 2023-03-16 MED ORDER — NALOXONE HCL 4 MG/10ML IJ SOLN: 1.0000 ug/kg/h | INTRAVENOUS | Status: DC | PRN

## 2023-03-16 MED ORDER — AMISULPRIDE (ANTIEMETIC) 5 MG/2ML IV SOLN: 10.0000 mg | Freq: Once | INTRAVENOUS | Status: DC | PRN

## 2023-03-16 MED ORDER — ACETAMINOPHEN 325 MG PO TABS: 650.0000 mg | ORAL_TABLET | ORAL | Status: DC | PRN

## 2023-03-16 MED ORDER — KETOROLAC TROMETHAMINE 30 MG/ML IJ SOLN
INTRAMUSCULAR | Status: AC
  Filled 2023-03-16: qty 1

## 2023-03-16 MED ORDER — ACETAMINOPHEN 500 MG PO TABS
1000.0000 mg | ORAL_TABLET | Freq: Four times a day (QID) | ORAL | Status: DC
  Filled 2023-03-16: qty 2

## 2023-03-16 MED ORDER — OXYTOCIN-SODIUM CHLORIDE 30-0.9 UT/500ML-% IV SOLN
INTRAVENOUS | Status: DC | PRN
  Administered 2023-03-16: 30 [IU] via INTRAVENOUS

## 2023-03-16 MED ORDER — DEXAMETHASONE SODIUM PHOSPHATE 10 MG/ML IJ SOLN
INTRAMUSCULAR | Status: DC | PRN
  Administered 2023-03-16: 10 mg via INTRAVENOUS

## 2023-03-16 MED ORDER — EPHEDRINE 5 MG/ML INJ: 10.0000 mg | INTRAVENOUS | Status: DC | PRN

## 2023-03-16 MED ORDER — MORPHINE SULFATE (PF) 0.5 MG/ML IJ SOLN
INTRAMUSCULAR | Status: AC
  Filled 2023-03-16: qty 10

## 2023-03-16 MED ORDER — ESCITALOPRAM OXALATE 10 MG PO TABS
10.0000 mg | ORAL_TABLET | Freq: Every day | ORAL | Status: DC
  Administered 2023-03-17 – 2023-03-19 (×3): 10 mg via ORAL
  Filled 2023-03-16 (×4): qty 1

## 2023-03-16 MED ORDER — NALOXONE HCL 0.4 MG/ML IJ SOLN: 0.4000 mg | INTRAMUSCULAR | Status: DC | PRN

## 2023-03-16 MED ORDER — OXYTOCIN-SODIUM CHLORIDE 30-0.9 UT/500ML-% IV SOLN
2.5000 [IU]/h | INTRAVENOUS | Status: DC
  Filled 2023-03-16: qty 500

## 2023-03-16 MED ORDER — ONDANSETRON HCL 4 MG/2ML IJ SOLN: 4.0000 mg | Freq: Once | INTRAMUSCULAR | Status: DC | PRN

## 2023-03-16 MED ORDER — PHENYLEPHRINE 80 MCG/ML (10ML) SYRINGE FOR IV PUSH (FOR BLOOD PRESSURE SUPPORT): 80.0000 ug | PREFILLED_SYRINGE | INTRAVENOUS | Status: DC | PRN

## 2023-03-16 SURGICAL SUPPLY — 43 items
APL PRP STRL LF DISP 70% ISPRP (MISCELLANEOUS) ×2
APL SKNCLS STERI-STRIP NONHPOA (GAUZE/BANDAGES/DRESSINGS) ×1
BARRIER ADHS 3X4 INTERCEED (GAUZE/BANDAGES/DRESSINGS) IMPLANT
BENZOIN TINCTURE PRP APPL 2/3 (GAUZE/BANDAGES/DRESSINGS) ×1 IMPLANT
BRR ADH 4X3 ABS CNTRL BYND (GAUZE/BANDAGES/DRESSINGS) ×1
CHLORAPREP W/TINT 26 (MISCELLANEOUS) ×2 IMPLANT
CLAMP UMBILICAL CORD (MISCELLANEOUS) IMPLANT
CLOTH BEACON ORANGE TIMEOUT ST (SAFETY) ×1 IMPLANT
DRAIN RELI 100 BL SUC LF ST (DRAIN)
DRSG OPSITE POSTOP 4X10 (GAUZE/BANDAGES/DRESSINGS) ×1 IMPLANT
ELECT REM PT RETURN 9FT ADLT (ELECTROSURGICAL) ×1
ELECTRODE REM PT RTRN 9FT ADLT (ELECTROSURGICAL) ×1 IMPLANT
EVACUATOR SILICONE 100CC (DRAIN) IMPLANT
EXTRACTOR VACUUM KIWI (MISCELLANEOUS) ×1 IMPLANT
GAUZE SPONGE 4X4 12PLY STRL LF (GAUZE/BANDAGES/DRESSINGS) IMPLANT
GLOVE BIO SURGEON STRL SZ7 (GLOVE) ×1 IMPLANT
GLOVE BIOGEL PI IND STRL 7.0 (GLOVE) ×3 IMPLANT
GOWN STRL REUS W/TWL LRG LVL3 (GOWN DISPOSABLE) ×2 IMPLANT
KIT ABG SYR 3ML LUER SLIP (SYRINGE) IMPLANT
NDL HYPO 25X5/8 SAFETYGLIDE (NEEDLE) IMPLANT
NEEDLE HYPO 25X5/8 SAFETYGLIDE (NEEDLE) IMPLANT
NS IRRIG 1000ML POUR BTL (IV SOLUTION) ×1 IMPLANT
PACK C SECTION WH (CUSTOM PROCEDURE TRAY) ×1 IMPLANT
PAD OB MATERNITY 4.3X12.25 (PERSONAL CARE ITEMS) ×1 IMPLANT
RTRCTR C-SECT PINK 25CM LRG (MISCELLANEOUS) ×1 IMPLANT
SPONGE LAP 18X18 X RAY DECT (DISPOSABLE) IMPLANT
STRIP CLOSURE SKIN 1/2X4 (GAUZE/BANDAGES/DRESSINGS) ×1 IMPLANT
SUT MNCRL 0 VIOLET CTX 36 (SUTURE) ×1 IMPLANT
SUT MNCRL AB 0 CT1 27 (SUTURE) IMPLANT
SUT MON AB 4-0 PS1 27 (SUTURE) ×1 IMPLANT
SUT PLAIN 2 0 (SUTURE) ×2
SUT PLAIN ABS 2-0 CT1 27XMFL (SUTURE) ×1 IMPLANT
SUT VIC AB 0 CTX 36 (SUTURE) ×2
SUT VIC AB 0 CTX36XBRD ANBCTRL (SUTURE) ×2 IMPLANT
SUT VIC AB 2-0 CT1 27 (SUTURE) ×1
SUT VIC AB 2-0 CT1 TAPERPNT 27 (SUTURE) ×1 IMPLANT
SUT VIC AB 2-0 SH 27 (SUTURE)
SUT VIC AB 2-0 SH 27XBRD (SUTURE) IMPLANT
SUT VIC AB 3-0 CT1 27 (SUTURE) ×1
SUT VIC AB 3-0 CT1 TAPERPNT 27 (SUTURE) ×1 IMPLANT
TOWEL OR 17X24 6PK STRL BLUE (TOWEL DISPOSABLE) ×1 IMPLANT
TRAY FOLEY W/BAG SLVR 14FR LF (SET/KITS/TRAYS/PACK) ×1 IMPLANT
WATER STERILE IRR 1000ML POUR (IV SOLUTION) ×1 IMPLANT

## 2023-03-16 NOTE — Progress Notes (Signed)
Labor Progress Note  Sally Cardenas is a 32 y.o. female, G3P0020, IUP at 39.4 weeks, presenting for latent labor, GBS-. Pt endorse + Fm.. Elevated BP x1 in MAU, PCR 0.12, labs unremarkable, does not meet criteria for GHTN. HSV 1 (spec WNL, np lesion, no prodromal s/sx, not on valtrex, Sickle cell HgbE Trait, Anemia Vit D Def, H/O depressive disorder possible Bipolar (Was on Depakote but discontinue when she found out she was pregnant, continue lexparo 10mg s), LEEP (2018 [facility]GSIL).   Subjective: Pt comfortable, discussed IUPC pt ok to place, discussed R/B/A of pitocin r/t no cervical change noted, pt verbalized consent to start.  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   Objective: BP 109/70   Pulse (!) 103   Temp 98.6 F (37 C) (Oral)   Resp 18   Ht 4\' 11"  (1.499 m)   Wt 85.7 kg   LMP 06/12/2022 (Exact Date)   SpO2 100%   BMI 38.17 kg/m  No intake/output data recorded. No intake/output data recorded. NST: FHR baseline 140 bpm, Variability: moderate, Accelerations:present, Decelerations:  Absent= Cat 1/Reactive CTX:  irregular, every 3-5 minutes Uterus gravid, soft non tender, moderate to palpate with contractions.  SVE:  Dilation: 6 Effacement (%): 90 Station: 0 Exam by:: [facility] CNM Pitocin at 83mUn/min IUPC placed tolerated well.   Assessment:  Sally Cardenas is a 32 y.o. female, G3P0020, IUP at 39.4 weeks, presenting for latent labor, GBS-. Pt endorse + Fm.. Elevated BP x1 in MAU, PCR 0.12, labs unremarkable, does not meet criteria for GHTN. HSV 1 (spec WNL, np lesion, no prodromal s/sx, not on valtrex, Sickle cell HgbE Trait, Anemia Vit D Def, H/O depressive disorder possible Bipolar (Was on Depakote but discontinue when  she found out she was pregnant, continue lexparo 10mg s), LEEP (2018 [facility]GSIL). Progressing in active labor post AROM. IUPC placed with ease.  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   Pain management:               IV pain management: x Fentanyl @ 0635, 0843, 1004 on 8/22  Nitrous: PRN             Epidural placement:  at 1149 on 8/22  GBS Negative  Elevated BP w/o dx of GHTN: BP elevated at 1118 156/86 and currently 109/70, asymptomatic, PCR 0.12, cmp, cbc unremarkable.    Plan: Continue labor plan Elevated BP: monitor BP, will dx with GHTN if 1 more BP >140/90s Continuous monitoring H/O Depressive disorder possible bipolar: continue lexapro 10mg , monitor mood, will need a mood stabilizer PP. Rest. Pt desires to start Wellbutrin PP, will watch for miana.  Frequent position changes to facilitate fetal rotation and descent. Will reassess with cervical exam at 4 hours or earlier if necessary No cervical changed noted start  pitocin per protocol 2x2 Anticipate labor progression and vaginal delivery.   [facility] CNM, FNP-C, PMHNP-BC  [address]  [place], [place] [address]  Cell: [phone]  Office Phone: [phone] Fax: [phone] 03/16/2023  2:54 PM

## 2023-03-16 NOTE — MAU Note (Signed)
.  Sally Cardenas is a 32 y.o. at [redacted]w[redacted]d here in MAU reporting:   Contractions every: 4 minutes Onset of ctx: Yesterday Pain score: 7/10  ROM: Possible ROM reports her underwear have been more wet than normal, first time she noticed this was 0040 Vaginal Bleeding: None Last SVE: none  Epidural: Planning  Fetal Movement: Reports positive FM

## 2023-03-16 NOTE — Progress Notes (Signed)
Labor Progress Note  Sally Cardenas is a 32 y.o. female, G3P0020, IUP at 39.4 weeks, presenting for latent labor, GBS-. Pt endorse + Fm.. Elevated BP x1 in MAU, PCR 0.12, labs unremarkable, does not meet criteria for GHTN. HSV 1 (spec WNL, np lesion, no prodromal s/sx, not on valtrex, Sickle cell HgbE Trait, Anemia Vit D Def, H/O depressive disorder possible Bipolar (Was on Depakote but discontinue when she found out she was pregnant, continue lexparo 10mg s), LEEP (2018 [facility]GSIL).   Subjective: Pt stable and denies questions, comfortable post epidural, discussed AROM R/BA and pt verbalized content. Pt stable partner at bedside.  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   Objective: BP 99/60   Pulse (!) 116   Temp 98.6 F (37 C) (Oral)   Resp 18   Ht 4\' 11"  (1.499 m)   Wt 85.7 kg   LMP 06/12/2022 (Exact Date)   SpO2 100%   BMI 38.17 kg/m  No intake/output data recorded. No intake/output data recorded. NST: FHR baseline 135 bpm, Variability: moderate, Accelerations:present, Decelerations:  Absent= Cat 1/Reactive CTX:  irregular, every 3-6 minutes Uterus gravid, soft non tender, moderate to palpate with contractions.  SVE:  Dilation: 6 Effacement (%): 90 Station: 0 Exam by:: [facility] CNM Pitocin at 41mUn/min AROM , clear tolerated well.   Assessment:  Sally Cardenas is a 32 y.o. female, G3P0020, IUP at 39.4 weeks, presenting for latent labor, GBS-. Pt endorse + Fm.. Elevated BP x1 in MAU, PCR 0.12, labs unremarkable, does not meet criteria for GHTN. HSV 1 (spec WNL, np lesion, no prodromal s/sx, not on valtrex, Sickle cell HgbE Trait, Anemia Vit D Def, H/O depressive disorder possible Bipolar (Was on Depakote but discontinue when  she found out she was pregnant, continue lexparo 10mg s), LEEP (2018 [facility]GSIL). Progressing in active labor post AROM.  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    Plan: Continue labor plan Elevated BP: monitor BP, will dx with GHTN if 1 more BP >140/90s Continuous monitoring H/O Depressive disorder possible bipolar: continue lexapro , monitor mood, will need a mood stabilizer PP. Rest Frequent position changes to facilitate fetal rotation and descent. Will reassess with cervical exam at 4 hours or earlier if necessary Will start  pitocin per protocol 2x2 if no cervical change  Anticipate labor progression and vaginal delivery.   [facility] aware of plan and verbalized  agreement.   [facility] CNM, FNP-C, PMHNP-BC  [address]  [place], [place] [address]  Cell: [phone]  Office Phone: [phone] Fax: [phone] 03/16/2023  1:15 PM

## 2023-03-16 NOTE — Progress Notes (Signed)
Labor Progress Note  Sally Cardenas is a 32 y.o. female, G3P0020, IUP at 39.4 weeks, presenting for latent labor, GBS-. Pt endorse + Fm.. Elevated BP x1 in MAU, PCR 0.12, labs unremarkable, does not meet criteria for GHTN. HSV 1 (spec WNL, np lesion, no prodromal s/sx, not on valtrex, Sickle cell HgbE Trait, Anemia Vit D Def, H/O depressive disorder possible Bipolar (Was on Depakote but discontinue when she found out she was pregnant, continue lexparo 10mg s), LEEP (2018 [facility]GSIL).   Subjective: Pt been able to rest, foul smell noted in room, new onset suspect chorio present with maternal fever, and white discharged noted from vagina in copious amounts.  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   Objective: BP 106/60   Pulse 100   Temp (!) 101.4 F (38.6 C) (Oral)   Resp 18   Ht 4\' 11"  (1.499 m)   Wt 85.7 kg   LMP 06/12/2022 (Exact Date)   SpO2 100%   BMI 38.17 kg/m  No intake/output data recorded. No intake/output data recorded. NST: FHR baseline 155 bpm, Variability: moderate, Accelerations:present, Decelerations:  Absent= Cat 1/Reactive CTX:  irregular, every 3-5 minutes Uterus gravid, soft non tender, moderate to palpate with contractions.  SVE:  Dilation: 7 Effacement (%): 80 Station: -1 Exam by:: [employer] [facility] CNM Pitocin at 62mUn/min Foul smelling white noted, copious amount noted on glove.   Assessment:  Sally Cardenas is a 32 y.o. female, G3P0020, IUP at 39.4 weeks, presenting for latent labor, GBS-. Pt endorse + Fm.. Elevated BP x1 in MAU, PCR 0.12, labs unremarkable, does not meet criteria for GHTN. HSV 1 (spec WNL, np lesion, no prodromal s/sx, not on valtrex, Sickle cell HgbE Trait, Anemia Vit D Def, H/O depressive  disorder possible Bipolar (Was on Depakote but discontinue when she found out she was pregnant, continue lexparo 10mg s), LEEP (2018 HGSIL). Progressing in active labor . 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  Induction:    Cytotec xN/A  Foley Bulb: N/A  Pitocin - 12  MVU: 100 with IUPC.   Pain management:               IV pain management: x Fentanyl @ 0635, 0843, 1004 on 8/22  Nitrous: PRN             Epidural placement:  at 1149 on 8/22  GBS Negative  Elevated BP w/o dx of GHTN: BP elevated at 1118 156/86 and currently 106/60, asymptomatic, PCR 0.12, cmp, cbc unremarkable.    Plan: Continue labor plan Elevated BP: monitor BP, will dx with GHTN if 1 more BP >140/90s Continuous monitoring H/O Depressive disorder possible bipolar: continue lexapro 10mg , monitor mood, will need a mood stabilizer PP. Rest. Pt desires to start Wellbutrin PP, will watch for mania.  Frequent position changes to facilitate fetal rotation and descent. Will reassess with cervical exam at  4 hours or earlier if necessary Continue pitocin per protocol 2x2 Suspected chorio: 1000mg  tylenol, fluid bolus, unasyn 3gm Q6H until 24 hours PP, Pending G/C Anticipate labor progression and vaginal delivery.   DR Sallye Ober notifed and updated.   [facility] CNM, FNP-C, PMHNP-BC  [address]  [place], [place] [address]  Cell: [phone]  Office Phone: [phone] Fax: [phone] 03/16/2023  5:39 PM

## 2023-03-16 NOTE — Transfer of Care (Signed)
Immediate Anesthesia Transfer of Care Note  Patient: Sally Cardenas  Procedure(s) Performed: CESAREAN SECTION  Patient Location: PACU  Anesthesia Type:Epidural  Level of Consciousness: awake, alert , and oriented  Airway & Oxygen Therapy: Patient Spontanous Breathing  Post-op Assessment: Report given to RN and Post -op Vital signs reviewed and stable  Post vital signs: Reviewed and stable  Last Vitals:  Vitals Value Taken Time  BP 112/66 03/16/23 2315  Temp    Pulse 91 03/16/23 2322  Resp 16 03/16/23 2322  SpO2 97 % 03/16/23 2322  Vitals shown include unfiled device data.  Last Pain:  Vitals:   03/16/23 2000  TempSrc:   PainSc: 0-No pain      Patients Stated Pain Goal: 0 (03/16/23 1041)  Complications: No notable events documented.

## 2023-03-16 NOTE — Op Note (Signed)
Cesarean Section Procedure Note  Indications: chorioamnionitis and failure to progress: arrest of dilation  Pre-operative Diagnosis: 39 week 4 day pregnancy.  Post-operative Diagnosis: same  Surgeon: Jackie Plum   Assistants: Dr. Lucianne Muss, [facility] fellow  Anesthesia: Epidural anesthesia  ASA Class: 2   Procedure Details  CS OP NOTE Information for the patient's newborn:  Lue, Dunckel [[id number]]  @[id number] @   Indication and Consent:  Patient presented in active labor and she was admitted at [redacted]w[redacted]d. Patient progressed to 6 cm when admitted for active labor this morning at 0753. Patient has since made little to no changed after AROM at 1130 and initiation of pitocin. Patient contractions have continue to be inadequate and no cervical change in > 6 hours in the setting of AROM and pitocin. Patient also suspected to chorioamnionitis. Recommendation for primary cesarean section for arrest of active labor made. Risks discussed include bleeding, infection, injury to surrounding organs including but not limited to bowel, bladder, and ureters. Also discussed risk of hysterectomy in case of life-threatening emergency, and death. Pt verbalizes understanding and wishes to proceed. Informed written and verbal consent obtained  Procedures: The patient was taken to the operating room where anesthesia was found to be adequate and infection prophylaxis (Unasyn) previously given for suspected chorioamnionitis and adequate for c-section infection prophylaxis. Discussed with pharmacy. She was prepared and draped in the dorsal supine position with a leftward tilt. A Pfannenstiel skin incision was made with scalpel. The incision was carried down to the fascia with Bovie electrocautery. The fascia was incised and extended laterally. The superior aspect of the fascia was grasped with Kocher clamps and the rectus muscle was dissected off with mayo scissors. In a similar fashion, the inferior aspect of the  fascia was elevated and the underlying rectus muscle and pyramidalis were dissected off. Hemostasis was achieved with the bovie. The rectus muscle was separated in the midline down to the level of pubic symphysis. Pre-peritoneal fatty tissue was extended superiorly and inferiorly to the bladder reflection with good visualization of the bladder.   The Alexis-O retractor was inserted and vesicouterine peritoneum identified. Intraabdominal survey revealed scant, clear peritoneal fluid and a thin lower uterine segment. The vesicouterine peritoneum was opened with scissors and the bladder flap was developed. A low transverse uterine incision was made with a scalpel, the amniotic sac was ruptured and scant fluid noted. The uterine incision was extended bluntly with lateral and upward traction.   The fetus was in cephalic presentation. The head was elevated out of the pelvis with special attention paid to avoid using the uterine incision as a fulcrum. Gentle fundal pressure was applied once the head was brought into the incision. The infant was delivered with no difficulty. The mouth and nose were suctioned with a bulb. The cord was clamped and cut. The infant was handed off to the awaiting NICU staff for evaluation. The placenta was delivered intact with manual massage of uterine fundus. IV oxytocin was initiated to facilitated uterine contrations.  The inside of uterus was wiped clean. The uterine incision was closed first with 0 Vicryl in a locking pattern, second imbricating layer by 0 Monocryl  in a running pattern. The ovaries and tubes were found to be normal. The tubes and ovaries were then returned to the abdominal cavity. Blood clots and fluid were wiped out of the abdomen and pelvis with moist laparotomy sponges. The uterine incision was reinspected and good hemostasis was noted. Interceed applied over hysterotomy site.  The peritoneum  was closed with 2-0 Vicryl in a continuous pattern. The muscle layer  was reapproximated with 3-0 Vicryl in an interupted pattern. The fascial layer was closed with 0 Vicryl suture. Scarpa's fascia was reapproximated with 2-0 plain gut suture in a continuous fashion. The skin was closed with 4-0 monocryl subcuticularly.The patient tolerated the procedure well. All the counts were correct times three. The patient was taken to the recovery room in a stable condition. Dr. Hester Mates was present for the entire procedure and Dr. Lucianne Muss [facility] fellow) assisted.     Findings: Live female infant, Apgars 6 and 9 at 1 and 5 minutes respectively  Estimated Blood Loss:   889 mL         Urine: 200 mL         Total IV Fluids:  1000 mL         Specimens: Placenta and Disposition:  Sent to Pathology         Complications:  None; patient tolerated the procedure well.         Disposition: PACU - hemodynamically stable.         Condition: stable  Attending Attestation: I was present and scrubbed and the assistant was required due to complexity of anatomy. Due to the complexity of the case, Dr. Lucianne Muss (OB fellow)  was asked to serve as surgeon assist. The assistant surgeon aided in safe handling of the pelvic structures, associated retraction, and exposure of the operative field.   Dr. Reesa Chew

## 2023-03-16 NOTE — Anesthesia Procedure Notes (Signed)
Epidural Patient location during procedure: OB Start time: 03/16/2023 11:13 AM End time: 03/16/2023 11:18 AM  Staffing Anesthesiologist: Shelton Silvas, MD Performed: anesthesiologist   Preanesthetic Checklist Completed: patient identified, IV checked, site marked, risks and benefits discussed, surgical consent, monitors and equipment checked, pre-op evaluation and timeout performed  Epidural Patient position: sitting Prep: DuraPrep Patient monitoring: heart rate, continuous pulse ox and blood pressure Approach: midline Location: L3-L4 Injection technique: LOR saline  Needle:  Needle type: Tuohy  Needle gauge: 17 G Needle length: 9 cm Catheter type: closed end flexible Catheter size: 20 Guage Test dose: negative and 1.5% lidocaine  Assessment Events: blood not aspirated, no cerebrospinal fluid, injection not painful, no injection resistance and no paresthesia  Additional Notes LOR @ 5.5  Patient identified. Risks/Benefits/Options discussed with patient including but not limited to bleeding, infection, nerve damage, paralysis, failed block, incomplete pain control, headache, blood pressure changes, nausea, vomiting, reactions to medications, itching and postpartum back pain. Confirmed with bedside nurse the patient's most recent platelet count. Confirmed with patient that they are not currently taking any anticoagulation, have any bleeding history or any family history of bleeding disorders. Patient expressed understanding and wished to proceed. All questions were answered. Sterile technique was used throughout the entire procedure. Please see nursing notes for vital signs. Test dose was given through epidural catheter and negative prior to continuing to dose epidural or start infusion. Warning signs of high block given to the patient including shortness of breath, tingling/numbness in hands, complete motor block, or any concerning symptoms with instructions to call for help. Patient  was given instructions on fall risk and not to get out of bed. All questions and concerns addressed with instructions to call with any issues or inadequate analgesia.    Reason for block:procedure for pain

## 2023-03-16 NOTE — Progress Notes (Addendum)
Sally Cardenas is a 32 y.o. G3P0020 at [redacted]w[redacted]d by admitted for active labor  Subjective: Patient progressed to 6 cm when admitted for active labor this morning at 0753. Patient has since made little to no changed after AROM at 1130 and initiation of pitocin. Patient contractions have continue to be inadequate and no cervical change in > 6 hours in the setting of AROM and pitocin. Patient also suspected to chorioamnionitis. Recommendation for primary cesarean section for arrest of active labor made. R/B/A discussed including but not limited to infection, bleeding, damage to surrounding structures reviewed. All questions answered to patients satisfaction. Patient wishes to proceed and informed written and verbal consent obtained.   Objective: BP 100/60   Pulse 90   Temp 100.3 F (37.9 C) (Oral)   Resp 18   Ht 4\' 11"  (1.499 m)   Wt 85.7 kg   LMP 06/12/2022 (Exact Date)   SpO2 99%   BMI 38.17 kg/m  No intake/output data recorded. 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

## 2023-03-16 NOTE — Anesthesia Preprocedure Evaluation (Addendum)
Anesthesia Evaluation  Patient identified by MRN, date of birth, ID band Patient awake    Reviewed: Allergy & Precautions, Patient's Chart, lab work & pertinent test results  Airway Mallampati: II       Dental no notable dental hx.    Pulmonary asthma , former smoker   Pulmonary exam normal        Cardiovascular negative cardio ROS Normal cardiovascular exam     Neuro/Psych  PSYCHIATRIC DISORDERS Anxiety Depression Bipolar Disorder      GI/Hepatic negative GI ROS, Neg liver ROS,,,  Endo/Other  negative endocrine ROS    Renal/GU negative Renal ROS     Musculoskeletal negative musculoskeletal ROS (+)    Abdominal   Peds  Hematology negative hematology ROS (+)   Anesthesia Other Findings   Reproductive/Obstetrics                             Anesthesia Physical Anesthesia Plan  ASA: 2  Anesthesia Plan: Epidural   Post-op Pain Management:    Induction:   PONV Risk Score and Plan:   Airway Management Planned:   Additional Equipment:   Intra-op Plan:   Post-operative Plan:   Informed Consent: I have reviewed the patients History and Physical, chart, labs and discussed the procedure including the risks, benefits and alternatives for the proposed anesthesia with the patient or authorized representative who has indicated his/her understanding and acceptance.       Plan Discussed with:   Anesthesia Plan Comments: (Lab Results      Component                Value               Date                      WBC                      19.0 (H)            03/16/2023                HGB                      13.9                03/16/2023                HCT                      41.6                03/16/2023                MCV                      74.8 (L)            03/16/2023                PLT                      199                 03/16/2023           )       Anesthesia Quick  Evaluation

## 2023-03-16 NOTE — H&P (Signed)
Sally Cardenas is a 32 y.o. female, G3P0020, IUP at 39.4 weeks, presenting for latent labor, GBS-. Pt endorse + Fm. Denies vaginal leakage. Denies vaginal bleeding. Elevated BP x1 in MAU, labs pending, does not meet criteria for GHTN.  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   Alcohol abuse with alcohol-induced mood disorder (HCC) 02/11/2021     Active Ambulatory Problems    Diagnosis Date Noted   Alcohol abuse with alcohol-induced mood disorder (HCC) 02/11/2021   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   Past Week   albuterol (PROVENTIL HFA;VENTOLIN HFA) 108 (90 Base) MCG/ACT inhaler Inhale 2 puffs into the lungs every 6 (six) hours as needed for wheezing or shortness of breath.    Past Week   cetirizine (ZYRTEC) 10 MG tablet Take 10 mg by mouth daily as needed for allergies.   Past Week   cholecalciferol (VITAMIN D3) 25 MCG (1000 UNIT) tablet Take 1,000 Units by mouth daily.   Past Week    escitalopram (LEXAPRO) 10 MG tablet Take 1 tablet (10 mg total) by mouth daily. 30 tablet 2 Past Week   fluticasone (FLONASE) 50 MCG/ACT nasal spray Place 1 spray into both nostrils daily as needed for allergies or rhinitis.   Past Week   montelukast (SINGULAIR) 10 MG tablet Take 10 mg by mouth daily.    Past Week   Prenatal Vit-Fe Fumarate-FA (MULTIVITAMIN-PRENATAL) 27-0.8 MG TABS tablet Take 1 tablet by mouth daily at 12 noon.   Past Week   divalproex (DEPAKOTE) 500 MG DR tablet TAKE 2 TABLETS(1000 MG) BY MOUTH DAILY 60 tablet 2    ibuprofen (ADVIL) 800 MG tablet Take 1 tablet (800 mg total) by mouth every 8 (eight) hours as needed for moderate pain. 21 tablet 0    valACYclovir (VALTREX) 500 MG tablet Take 500 mg by mouth 2 (two) times daily as needed (fever blisters).  1 More than a month    Past Medical History:  Diagnosis Date   Alcohol dependence (HCC) 06/07/2021   Allergy    Anxiety    Asthma    Bipolar affective disorder, depressed, moderate (HCC) 05/10/2021   Depression    Vaginal bleeding 09/26/2016     No current facility-administered medications on file prior to encounter.   Current Outpatient Medications on File Prior to Encounter  Medication Sig Dispense Refill   acetaminophen (TYLENOL) 500 MG tablet Take 1,000 mg by mouth daily as needed for  moderate pain or headache.     albuterol (PROVENTIL HFA;VENTOLIN HFA) 108 (90 Base) MCG/ACT inhaler Inhale 2 puffs into the lungs every 6 (six) hours as needed for wheezing or shortness of breath.      cetirizine (ZYRTEC) 10 MG tablet Take 10 mg by mouth daily as needed for allergies.     cholecalciferol (VITAMIN D3) 25 MCG (1000 UNIT) tablet Take 1,000 Units by mouth daily.     escitalopram (LEXAPRO) 10 MG tablet Take 1 tablet (10 mg total) by mouth daily. 30 tablet 2   fluticasone (FLONASE) 50 MCG/ACT nasal spray Place 1 spray into both nostrils daily as needed for allergies or rhinitis.     montelukast (SINGULAIR) 10 MG tablet Take  10 mg by mouth daily.      Prenatal Vit-Fe Fumarate-FA (MULTIVITAMIN-PRENATAL) 27-0.8 MG TABS tablet Take 1 tablet by mouth daily at 12 noon.     divalproex (DEPAKOTE) 500 MG DR tablet TAKE 2 TABLETS(1000 MG) BY MOUTH DAILY 60 tablet 2   ibuprofen (ADVIL) 800 MG tablet Take 1 tablet (800 mg total) by mouth every 8 (eight) hours as needed for moderate pain. 21 tablet 0   valACYclovir (VALTREX) 500 MG tablet Take 500 mg by mouth 2 (two) times daily as needed (fever blisters).  1     No Known Allergies  History of present pregnancy: Pt Info/Preference:  Screening/Consents:  Labs:   EDD: Estimated Date of Delivery: 03/19/23  Establised: Patient's last menstrual period was 06/12/2022 (exact date).  Anatomy Scan: Date: 11/22/2021 Placenta Location: posterior Genetic Screen: Panoroma:LR AFP: WNL First Tri: Quad: Horizon: Neg  Office: [facility]             First PNV: 8.6 weeks Blood Type --/--/O POS (08/22 0615)  Language: english Last PNV: 39.3 weeks Rhogam    Flu Vaccine:  declined   Antibody NEG (08/22 0615)  TDaP vaccine declined   GTT: Early: 5.2 Third Trimester: 134  Feeding Plan: Breast/bottle BTL: no Rubella: Immune (01/19 0000)  Contraception: ??? VBAC: no RPR: Nonreactive (01/19 0000)   Circumcision: N/A female   HBsAg: Negative (01/19 0000)  Pediatrician:  ???   HIV: Non-reactive (01/19 0000)   Prenatal Classes: no Additional [place]: 03/06/2023 vxt, posterior, fluid WNL, BPP 8/10, EFW 7.5lbs, female,  GBS: Negative/-- (07/30 0000)(For PCN allergy, check sensitivities)       Chlamydia: neg    MFM Referral/Consult:  GC: neg  Support Person: Partner on his way   PAP: 2021 HGSIL  Pain Management: epidural Neonatologist Referral:  Hgb Electrophoresis:  Hgb E  Birth Plan: DCC   Hgb NOB: 12.9    28W: 11.7   OB History     Gravida  3   Para  0   Term  0   Preterm  0   AB  2   Living  0      SAB  1   IAB  0   Ectopic  0   Multiple  0   Live Births  0          Past Medical  History:  Diagnosis Date   Alcohol dependence (HCC) 06/07/2021   Allergy    Anxiety    Asthma    Bipolar affective disorder, depressed, moderate (HCC) 05/10/2021   Depression    Vaginal bleeding 09/26/2016   Past Surgical History:  Procedure Laterality Date   CERVICAL BIOPSY  W/ LOOP ELECTRODE EXCISION     DILATION AND EVACUATION N/A 04/24/2018  Procedure: DILATATION AND EVACUATION;  Surgeon: Hoover Browns, MD;  Location: [facility];  Service: Gynecology;  Laterality: N/A;   Family History: family history includes Cancer in her father; Hyperlipidemia in her mother; Hypertension in her mother. Social History:  reports that she quit smoking about 7 years ago. Her smoking use included cigarettes and e-cigarettes. She has never used smokeless tobacco. She reports that she does not currently use alcohol after a past usage of about 8.0 standard drinks of alcohol per week. She reports that she does not currently use drugs after having used the following drugs: Marijuana and Cocaine.   Prenatal Transfer Tool  Maternal Diabetes: No Genetic Screening: Normal Maternal Ultrasounds/Referrals: Normal Fetal Ultrasounds or other Referrals:  None Maternal Substance Abuse:  No Significant Maternal Medications:  None Significant Maternal Lab Results: Group B Strep negative  ROS:  Review of Systems  Constitutional: Negative.   HENT: Negative.    Eyes: Negative.   Respiratory: Negative.    Cardiovascular: Negative.   Gastrointestinal:  Positive for abdominal pain.  Genitourinary: Negative.   Musculoskeletal: Negative.   Skin: Negative.   Neurological: Negative.   Psychiatric/Behavioral: Negative.       Physical Exam: BP (!) 142/90 (BP Location: Right Arm)   Pulse 74   Temp 97.9 F (36.6 C) (Oral)   Resp 18   Ht 4\' 11"  (1.499 m)   Wt 85.7 kg   LMP 06/12/2022 (Exact Date)   SpO2 98%   BMI 38.17 kg/m   Physical Exam Vitals and nursing note reviewed.  Constitutional:      Appearance:  Normal appearance.  HENT:     Head: Normocephalic and atraumatic.     Nose: Nose normal.     Mouth/Throat:     Mouth: Mucous membranes are moist.  Eyes:     Pupils: Pupils are equal, round, and reactive to light.  Cardiovascular:     Rate and Rhythm: Normal rate and regular rhythm.     Pulses: Normal pulses.     Heart sounds: Normal heart sounds.  Pulmonary:     Effort: Pulmonary effort is normal.     Breath sounds: Normal breath sounds.  Abdominal:     General: Bowel sounds are normal.  Genitourinary:    General: Normal vulva.     Rectum: Normal.     Comments: Speculum exam WNL, no lesions noted.  Musculoskeletal:        General: Normal range of motion.     Cervical back: Normal range of motion and neck supple.  Skin:    General: Skin is warm.     Capillary Refill: Capillary refill takes less than 2 seconds.  Neurological:     General: No focal deficit present.     Mental Status: She is alert.  Psychiatric:        Mood and Affect: Mood normal.      NST: FHR baseline 125 bpm, Variability: moderate, Accelerations:present, Decelerations:  Absent= Cat 1/Reactive UC:   regular, every 4-5 minutes SVE:   Dilation: 6 Effacement (%): 90 Station: -2 Exam by:: [facility] CNM, vertex verified by fetal sutures.  Leopold's: Position vertex, EFW 7lbs via leopold's.   Labs: Results for orders placed or performed during the hospital encounter of 03/16/23 (from the past 24 hour(s))  Protein / creatinine ratio, urine     Status: None   Collection Time: 03/16/23  6:12 AM  Result Value Ref Range   Creatinine, Urine 81 mg/dL   Total Protein, Urine 10  mg/dL   Protein Creatinine Ratio 0.12 0.00 - 0.15 mg/mg[Cre]  Type and screen [facility]     Status: None   Collection Time: 03/16/23  6:15 AM  Result Value Ref Range   ABO/RH(D) O POS    Antibody Screen NEG    Sample Expiration      03/19/2023,2359 Performed at [facility], [address]  [place], [place] [address]   CBC     Status: Abnormal   Collection Time: 03/16/23  6:18 AM  Result Value Ref Range   WBC 19.0 (H) 4.0 - 10.5 K/uL   RBC 5.56 (H) 3.87 - 5.11 MIL/uL   Hemoglobin 13.9 12.0 - 15.0 g/dL   HCT 02.5 42.7 - 06.2 %   MCV 74.8 (L) 80.0 - 100.0 fL   MCH 25.0 (L) 26.0 - 34.0 pg   MCHC 33.4 30.0 - 36.0 g/dL   RDW 37.6 28.3 - 15.1 %   Platelets 199 150 - 400 K/uL   nRBC 0.0 0.0 - 0.2 %  Comprehensive metabolic panel     Status: Abnormal   Collection Time: 03/16/23  6:18 AM  Result Value Ref Range   Sodium 135 135 - 145 mmol/L   Potassium 3.8 3.5 - 5.1 mmol/L   Chloride 102 98 - 111 mmol/L   CO2 21 (L) 22 - 32 mmol/L   Glucose, Bld 103 (H) 70 - 99 mg/dL   BUN 8 6 - 20 mg/dL   Creatinine, Ser 7.61 0.44 - 1.00 mg/dL   Calcium 9.0 8.9 - 60.7 mg/dL   Total Protein 6.4 (L) 6.5 - 8.1 g/dL   Albumin 2.9 (L) 3.5 - 5.0 g/dL   AST 18 15 - 41 U/L   ALT 12 0 - 44 U/L   Alkaline Phosphatase 138 (H) 38 - 126 U/L   Total Bilirubin 0.7 0.3 - 1.2 mg/dL   GFR, Estimated >37 >10 mL/min   Anion gap 12 5 - 15    Imaging:  [place] MFM OB FOLLOW UP  Result Date: 03/06/2023 ----------------------------------------------------------------------  OBSTETRICS REPORT                       (Signed Final 03/06/2023 04:00 pm) ---------------------------------------------------------------------- Patient Info  ID #:       [id number]                          D.O.B.:  08/02/1990 (32 yrs)  Name:       Sally Cardenas                  Visit Date: 03/06/2023 12:50 pm ---------------------------------------------------------------------- Performed By  Attending:        Ma Rings MD         Ref. Address:     [facility]  [address]                                                             [address]                                                             [place], [place]                                                              [address]  Performed By:     Isac Sarna        Location[facility]                                  [facility]  Referred By:      Hoover Browns MD ---------------------------------------------------------------------- Orders  #  Description                           Code        Ordered By  1  [place] MFM OB FOLLOW UP                   76816.01    BURK SCHAIBLE  2  [place] MFM FETAL BPP                      60454.0     [facility]     W/NONSTRESS ----------------------------------------------------------------------  #  Order #                     Accession #                Episode #  1  [id number]                   [id number]  [id number]  2  [id number]                   [id number]                 [id number] ---------------------------------------------------------------------- Indications  Fetal arrhythmia affecting pregnancy,          O36.8390  antepartum  Tobacco use complicating pregnancy, third      O99.333  trimester  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 Cord Insertion:      Previously seen  Amniotic Fluid  AFI FV:      Within normal limits  AFI Sum(cm)     %Tile       Largest Pocket(cm)  18.75           74           7.1  RUQ(cm)       RLQ(cm)       LUQ(cm)        LLQ(cm)  3.24          2.15          6.26           7.1 ---------------------------------------------------------------------- Biophysical Evaluation  Amniotic F.V:   Pocket => 2 cm             F. Tone:        Observed  F. Movement:    Observed                   N.S.T:          Reactive  F. Breathing:   Not Observed               Score:          8/10 ---------------------------------------------------------------------- Biometry  BPD:      96.6  mm     G. Age:  39w 3d         95  %    CI:        83.96   %    70 - 86  FL/HC:      20.4   %    20.9 - 22.7  HC:      332.3  mm     G. Age:  37w 6d         26  %    HC/AC:      0.96        0.92 - 1.05  AC:      346.5  mm     G. Age:  38w 4d         79  %    FL/BPD:     70.3   %    71 - 87  FL:       67.9  mm     G. Age:  34w 6d        1.6  %    FL/AC:      19.6   %    20 - 24  HUM:      60.8  mm     G. Age:  35w 2d         22  %  Est. FW:    3303  gm      7 lb 5 oz     54  % ---------------------------------------------------------------------- OB History  Blood Type:   O+  Gravidity:    3          SAB:   1  TOP:          1        Living:  0 ---------------------------------------------------------------------- Gestational Age  LMP:           38w 1d        Date:  06/12/22                 EDD:   03/19/23  U/S Today:     37w 5d                                        EDD:   03/22/23  Best:          38w 1d     Det. By:  LMP  (06/12/22)          EDD:   03/19/23 ---------------------------------------------------------------------- Anatomy  Cranium:               Appears normal         LVOT:                   Previously seen  Cavum:                 Previously seen        Aortic Arch:            Previously seen  Ventricles:            Appears normal         Ductal Arch:            Previously seen  Choroid Plexus:        Previously seen        Diaphragm:               Appears normal  Cerebellum:            Previously seen        Stomach:  Appears normal, left                                                                        sided  Posterior Fossa:       Previously seen        Abdomen:                Previously seen  Nuchal Fold:           Not applicable (>20    Abdominal Wall:         Previously seen                         wks GA)  Face:                  Orbits and profile     Cord Vessels:           Previously seen                         previously seen  Lips:                  Previously seen        Kidneys:                Appear normal  Palate:                Not well visualized    Bladder:                Appears normal  Thoracic:              Appears normal         Spine:                  Previously seen  Heart:                 Previously seen        Upper Extremities:      Previously seen  RVOT:                  Previously seen        Lower Extremities:      Previously seen  Other:  Female gender, VC, 3VTV, Nasal bone, lenses, maxilla, mandible,          falx, Heels/feet, and open hands/5th digits prev visualized.          Technically difficult due to fetal position. ---------------------------------------------------------------------- Comments  This patient was seen for a follow up growth scan due to a  fetal arrhythmia most likely PACs noted on her prior  ultrasound exams.  She denies any problems since her last  exam and reports feeling fetal movements throughout the day.  She was informed that the fetal growth and amniotic fluid  level appears appropriate for her gestational age.  A biophysical profile performed today was 8/10 with a  reactive NST.  She received a -2 for fetal breathing  movements that did not meet criteria.  A fetal arrhythmia was not noted on today's exam.  She will return in 1 week for another NST.  As the  fetal arrhythmia has resolved and as the fetal growth  is within normal limits, she may await spontaneous labor for  delivery.  ----------------------------------------------------------------------                   Ma Rings, MD Electronically Signed Final Report   03/06/2023 04:00 pm ----------------------------------------------------------------------   [place] MFM FETAL BPP W/NONSTRESS  Result Date: 03/06/2023 ----------------------------------------------------------------------  OBSTETRICS REPORT                       (Signed Final 03/06/2023 04:00 pm) ---------------------------------------------------------------------- Patient Info  ID #:       [id number]                          D.O.B.:  07-24-1991 (32 yrs)  Name:       Sally Cardenas                  Visit Date: 03/06/2023 12:50 pm ---------------------------------------------------------------------- Performed By  Attending:        Ma Rings MD         Ref. Address:     [facility]                                                             [address]                                                             [address]                                                             [place], [place]                                                             [address]  Performed By:     Isac Sarna        Location:         [facility]                    [facility]                                  [facility]  Referred By:      Hoover Browns MD ---------------------------------------------------------------------- Orders  #  Description                           Code        Ordered By  1  [place] MFM OB FOLLOW UP                   76816.01    BURK SCHAIBLE  2  [place] MFM FETAL BPP                      61607.3     [facility]     W/NONSTRESS ----------------------------------------------------------------------  #  Order #                     Accession #                Episode #   1  [id number]                   [id number]                 [id number]  2  [id number]                   [id number]                 [id number] ---------------------------------------------------------------------- Indications  Fetal arrhythmia affecting pregnancy,          P53.6144  antepartum  Tobacco use complicating pregnancy, third      O99.333  trimester  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 Cord Insertion:      Previously seen  Amniotic Fluid  AFI FV:      Within normal limits  AFI Sum(cm)     %  Tile       Largest Pocket(cm)  18.75           74          7.1  RUQ(cm)       RLQ(cm)       LUQ(cm)        LLQ(cm)  3.24          2.15          6.26           7.1 ---------------------------------------------------------------------- Biophysical Evaluation  Amniotic F.V:   Pocket => 2 cm             F. Tone:        Observed  F. Movement:    Observed                   N.S.T:          Reactive  F. Breathing:   Not Observed               Score:          8/10 ---------------------------------------------------------------------- Biometry  BPD:      96.6  mm     G. Age:  39w 3d         95  %    CI:        83.96   %    70 - 86                                                          FL/HC:      20.4   %    20.9 - 22.7  HC:      332.3  mm     G. Age:  37w 6d         26  %    HC/AC:      0.96        0.92 - 1.05  AC:      346.5  mm     G. Age:  38w 4d         79  %     FL/BPD:     70.3   %    71 - 87  FL:       67.9  mm     G. Age:  34w 6d        1.6  %    FL/AC:      19.6   %    20 - 24  HUM:      60.8  mm     G. Age:  35w 2d         22  %  Est. FW:    3303  gm      7 lb 5 oz     54  % ---------------------------------------------------------------------- OB History  Blood Type:   O+  Gravidity:    3          SAB:   1  TOP:          1        Living:  0 ---------------------------------------------------------------------- Gestational Age  LMP:           38w 1d        Date:  06/12/22  EDD:   03/19/23  U/S Today:     37w 5d                                        EDD:   03/22/23  Best:          38w 1d     Det. By:  LMP  (06/12/22)          EDD:   03/19/23 ---------------------------------------------------------------------- Anatomy  Cranium:               Appears normal         LVOT:                   Previously seen  Cavum:                 Previously seen        Aortic Arch:            Previously seen  Ventricles:            Appears normal         Ductal Arch:            Previously seen  Choroid Plexus:        Previously seen        Diaphragm:              Appears normal  Cerebellum:            Previously seen        Stomach:                Appears normal, left                                                                        sided  Posterior Fossa:       Previously seen        Abdomen:                Previously seen  Nuchal Fold:           Not applicable (>20    Abdominal Wall:         Previously seen                         wks GA)  Face:                  Orbits and profile     Cord Vessels:           Previously seen                         previously seen  Lips:                  Previously seen        Kidneys:                Appear normal  Palate:                Not well visualized    Bladder:  Appears normal  Thoracic:              Appears normal         Spine:                  Previously seen  Heart:                 Previously seen        Upper  Extremities:      Previously seen  RVOT:                  Previously seen        Lower Extremities:      Previously seen  Other:  Female gender, VC, 3VTV, Nasal bone, lenses, maxilla, mandible,          falx, Heels/feet, and open hands/5th digits prev visualized.          Technically difficult due to fetal position. ---------------------------------------------------------------------- Comments  This patient was seen for a follow up growth scan due to a  fetal arrhythmia most likely PACs noted on her prior  ultrasound exams.  She denies any problems since her last  exam and reports feeling fetal movements throughout the day.  She was informed that the fetal growth and amniotic fluid  level appears appropriate for her gestational age.  A biophysical profile performed today was 8/10 with a  reactive NST.  She received a -2 for fetal breathing  movements that did not meet criteria.  A fetal arrhythmia was not noted on today's exam.  She will return in 1 week for another NST.  As the fetal arrhythmia has resolved and as the fetal growth  is within normal limits, she may await spontaneous labor for  delivery. ----------------------------------------------------------------------                   Ma Rings, MD Electronically Signed Final Report   03/06/2023 04:00 pm ----------------------------------------------------------------------   [place] MFM FETAL BPP WO NON STRESS  Result Date: 02/27/2023 ----------------------------------------------------------------------  OBSTETRICS REPORT                       (Signed Final 02/27/2023 02:39 pm) ---------------------------------------------------------------------- Patient Info  ID #:       [id number]                          D.O.B.:  Jun 27, 1991 (32 yrs)  Name:       Sally Cardenas                  Visit Date: 02/27/2023 01:55 pm ---------------------------------------------------------------------- Performed By  Attending:        Noralee Space MD        Ref. Address:     [facility]  [address]                                                             [address]                                                             [place], [place]                                                             [address]  Performed By:     Reinaldo Raddle            [facility]                                     [facility]  Referred By:      Hoover Browns MD ---------------------------------------------------------------------- Orders  #  Description                           Code        Ordered By  1  [place] MFM FETAL BPP WO NON               32440.10    [facility]     STRESS ----------------------------------------------------------------------  #  Order #                     Accession #                Episode #  1  [id number]                   [id number]                 [id number] ---------------------------------------------------------------------- Indications  Fetal arrhythmia affecting pregnancy,          R51.8841  antepartum  Tobacco use complicating pregnancy, third  N82.956  trimester  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 Cord Insertion:      Previously visualized  Amniotic Fluid  AFI FV:      Within normal limits  AFI Sum(cm)     %Tile       Largest Pocket(cm)  15.85           60          7.6  RUQ(cm)       RLQ(cm)       LUQ(cm)        LLQ(cm)  7.6           3.69          4.56           0 ---------------------------------------------------------------------- Biophysical Evaluation  Amniotic F.V:   Within normal limits       F. Tone:        Observed  F. Movement:    Observed                   Score:          8/8  F. Breathing:   Observed ---------------------------------------------------------------------- Biometry  LV:        3.6  mm ---------------------------------------------------------------------- OB History  Blood Type:   O+  Gravidity:    3          SAB:   1  TOP:          1        Living:  0 ---------------------------------------------------------------------- Gestational Age  LMP:           37w 1d        Date:  06/12/22                 EDD:   03/19/23  Best:          37w 1d     Det. By:  LMP  (06/12/22)          EDD:   03/19/23 ---------------------------------------------------------------------- Anatomy  Cranium:               Appears normal         LVOT:  Previously seen  Cavum:                 Previously seen        Aortic Arch:            Previously seen  Ventricles:            Appears normal         Ductal Arch:            Previously seen  Choroid Plexus:        Previously seen        Diaphragm:              Appears normal  Cerebellum:            Previously seen        Stomach:                Appears normal, left                                                                        sided  Posterior Fossa:       Previously seen        Abdomen:                Previously seen  Nuchal Fold:           Not applicable (>20    Abdominal Wall:         Previously seen                         wks GA)  Face:                  Orbits and profile     Cord Vessels:           Previously seen                          previously seen  Lips:                  Previously seen        Kidneys:                Appear normal  Palate:                Not well visualized    Bladder:                Appears normal  Thoracic:              Appears normal         Spine:                  Previously seen  Heart:                 Previously seen        Upper Extremities:      Previously seen  RVOT:                  Previously seen        Lower Extremities:      Previously seen  Other:  Female gender,  VC, 3VTV, Nasal bone, lenses, maxilla, mandible,          falx, Heels/feet, and open hands/5th digits prev visualized.          Technically difficult due to fetal position. ---------------------------------------------------------------------- Cervix Uterus Adnexa  Cervix  Not visualized (advanced GA >24wks)  Uterus  No abnormality visualized.  Right Ovary  Not visualized.  Left Ovary  Not visualized.  Cul De Sac  No free fluid seen.  Adnexa  No adnexal mass visualized ---------------------------------------------------------------------- Impression  Amniotic fluid is normal and good fetal activity is seen  .Antenatal testing is reassuring. BPP 8/8. Fetal heart rate and  rhythm appear normal. ---------------------------------------------------------------------- Recommendations  -Has an appt for BPP next week. ----------------------------------------------------------------------                 Noralee Space, MD Electronically Signed Final Report   02/27/2023 02:39 pm ----------------------------------------------------------------------    MAU Course: Orders Placed This Encounter  Procedures   OB RESULT CONSOLE Group B Strep   CBC   RPR   Protein / creatinine ratio, urine   Comprehensive metabolic panel   OB RESULTS CONSOLE GC/Chlamydia   OB RESULTS CONSOLE RPR   OB RESULTS CONSOLE HIV antibody   OB RESULTS CONSOLE Rubella Antibody   OB RESULTS CONSOLE Hepatitis B surface antigen   Hepatitis C antibody   OB RESULTS CONSOLE GC/Chlamydia    Diet clear liquid Room service appropriate? Yes; Fluid consistency: Thin   Vitals signs per unit policy   Notify physician (specify)   Fetal monitoring per unit policy   Activity as tolerated   Cervical Exam   Measure blood pressure post delivery every 15 min x 1 hour then every 30 min x 1 hour   Fundal check post delivery every 15 min x 1 hour then every 30 min x 1 hour   Apply Labor & Delivery Care Plan   If Rapid HIV test positive or known HIV positive: initiate AZT orders   May in and out cath x 2 for inability to void   Insert urethral catheter X 1 PRN If Coude Catheter is chosen, qualified resources by campus can be found in the clinical skills nursing procedure for Coude Catheter 1. If straight catheterized > 2 times or patient unable to void post epidural plac...   Refer to Sidebar Report Urinary (Foley) Catheter Indications   Refer to Sidebar Report Post Indwelling Urinary Catheter Removal and Intervention Guidelines   Discontinue foley prior to vaginal delivery   Initiate Oral Care Protocol   Initiate Carrier Fluid Protocol   Informed Consent Details: Physician/Practitioner Attestation; Transcribe to consent form and obtain patient signature   Patient may have epidural placement upon request   May use local infiltration of 1% lidocaine plain to produce a skin wheal prior to IV insertion   Notify in-house Anesthesia team of nausea and vomiting greater than 5 hours   Assess for signs/symptoms of PIH/preeclampsia   RN to place order for: CBC if one has not been drawn in the past 6 hours for all patients with hypertensive disease, pre-eclampsia, eclampsia, thrombocytopenia or previous PLTC<150,000.   Identify to Anesthesia if patient plans to have postpartum tubal ligation; do not remove epidural without discussion with Anesthesiologist   Vital signs following Epidural Placement, re-bolus or re-dose monitor patient's BP and oxygen saturation every 5 minutes for 30 minutes   Pain  Assessment Document numeric pain score   RN to remain at bedside continuously for 30 minutes post epidural placement,  post re-bolus / re-dose   Do not administer IV opioids while epidural is in place.   Do not adjust epidural rate or discontinue epidural without notifying the anesthesiologist.   Notify Anesthesia if the patient becomes short of breath or complains of heaviness in chest, chest pain, and/or unrelieved pain   Notify Anesthesia prior to discontinuing epidural infusion   Full code   Nitrous Oxide 50%/Oxygen 50%   Type and screen [facility]   Insert and maintain IV Line   Admit to Inpatient (patient's expected length of stay will be greater than 2 midnights or inpatient only procedure)   Meds ordered this encounter  Medications   lactated ringers infusion   oxytocin (PITOCIN) IV BOLUS FROM BAG   oxytocin (PITOCIN) IV infusion 30 units in NS 500 mL - Premix   lactated ringers infusion 500-1,000 mL   acetaminophen (TYLENOL) tablet 650 mg   oxyCODONE-acetaminophen (PERCOCET/ROXICET) 5-325 MG per tablet 1 tablet   oxyCODONE-acetaminophen (PERCOCET/ROXICET) 5-325 MG per tablet 2 tablet   ondansetron (ZOFRAN) injection 4 mg   sodium citrate-citric acid (ORACIT) solution 30 mL   lidocaine (PF) (XYLOCAINE) 1 % injection 30 mL   fentaNYL (SUBLIMAZE) injection 50-100 mcg   ePHEDrine injection 10 mg   PHENYLephrine 80 mcg/ml in normal saline Adult IV Push Syringe (For Blood Pressure Support)   lactated ringers infusion 500 mL   fentaNYL 2 mcg/mL w/ bupivacaine 0.125% in NS 250 mL epidural infusion   diphenhydrAMINE (BENADRYL) injection 12.5 mg   ePHEDrine injection 10 mg   PHENYLephrine 80 mcg/ml in normal saline Adult IV Push Syringe (For Blood Pressure Support)    Assessment/Plan: Lashaunta Weinhardt is a 32 y.o. female, G3P0020, IUP at 39.4 weeks, presenting for latent labor, GBS-. Pt endorse + Fm. Denies vaginal leakage. Denies vaginal bleeding. Elevated BP x1 in  MAU, labs pending, does not meet criteria for GHTN.  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   Plan: Admit to [facility] per consult with Dr Sallye Ober Routine [facility] orders Pain med/epidural prn. Elevated BP with No dx of GHTN: CMP, CBC, PCR.  Anticipate labor progression   [facility] CNM, FNP-C, PMHNP-BC  [address]  [place], [place] [address]  Cell: [phone]  Office Phone: [phone] Fax: [phone] 03/16/2023  9:14 AM

## 2023-03-17 ENCOUNTER — Encounter (HOSPITAL_COMMUNITY): Payer: Self-pay | Admitting: Obstetrics and Gynecology

## 2023-03-17 LAB — CBC
HCT: 33.7 % — ABNORMAL LOW (ref 36.0–46.0)
Hemoglobin: 11.4 g/dL — ABNORMAL LOW (ref 12.0–15.0)
MCH: 26.1 pg (ref 26.0–34.0)
MCHC: 33.8 g/dL (ref 30.0–36.0)
MCV: 77.3 fL — ABNORMAL LOW (ref 80.0–100.0)
Platelets: 168 10*3/uL (ref 150–400)
RBC: 4.36 MIL/uL (ref 3.87–5.11)
RDW: 14.6 % (ref 11.5–15.5)
WBC: 26.9 10*3/uL — ABNORMAL HIGH (ref 4.0–10.5)
nRBC: 0 % (ref 0.0–0.2)

## 2023-03-17 MED ORDER — GABAPENTIN 100 MG PO CAPS
100.0000 mg | ORAL_CAPSULE | Freq: Three times a day (TID) | ORAL | Status: DC
  Administered 2023-03-17 – 2023-03-19 (×7): 100 mg via ORAL
  Filled 2023-03-17 (×7): qty 1

## 2023-03-17 MED ORDER — OXYCODONE HCL 5 MG PO TABS
5.0000 mg | ORAL_TABLET | ORAL | Status: DC | PRN
  Administered 2023-03-18 – 2023-03-19 (×2): 5 mg via ORAL
  Administered 2023-03-19: 10 mg via ORAL
  Administered 2023-03-19: 5 mg via ORAL
  Filled 2023-03-17: qty 1
  Filled 2023-03-17: qty 2
  Filled 2023-03-17 (×2): qty 1

## 2023-03-17 MED ORDER — SODIUM CHLORIDE 0.9 % IV SOLN: INTRAVENOUS | Status: DC

## 2023-03-17 MED ORDER — OXYTOCIN-SODIUM CHLORIDE 30-0.9 UT/500ML-% IV SOLN
2.5000 [IU]/h | INTRAVENOUS | Status: AC
  Administered 2023-03-17: 2.5 [IU]/h via INTRAVENOUS

## 2023-03-17 MED ORDER — MENTHOL 3 MG MT LOZG: 1.0000 | LOZENGE | OROMUCOSAL | Status: DC | PRN

## 2023-03-17 MED ORDER — SIMETHICONE 80 MG PO CHEW: 80.0000 mg | CHEWABLE_TABLET | ORAL | Status: DC | PRN

## 2023-03-17 MED ORDER — TETANUS-DIPHTH-ACELL PERTUSSIS 5-2.5-18.5 LF-MCG/0.5 IM SUSY: 0.5000 mL | PREFILLED_SYRINGE | Freq: Once | INTRAMUSCULAR | Status: DC

## 2023-03-17 MED ORDER — ACETAMINOPHEN 500 MG PO TABS
1000.0000 mg | ORAL_TABLET | Freq: Four times a day (QID) | ORAL | Status: DC
  Administered 2023-03-17 – 2023-03-19 (×10): 1000 mg via ORAL
  Filled 2023-03-17 (×11): qty 2

## 2023-03-17 MED ORDER — COCONUT OIL OIL: 1.0000 | TOPICAL_OIL | Status: DC | PRN

## 2023-03-17 MED ORDER — SIMETHICONE 80 MG PO CHEW
80.0000 mg | CHEWABLE_TABLET | Freq: Three times a day (TID) | ORAL | Status: DC
  Administered 2023-03-17 – 2023-03-19 (×8): 80 mg via ORAL
  Filled 2023-03-17 (×8): qty 1

## 2023-03-17 MED ORDER — PRENATAL MULTIVITAMIN CH
1.0000 | ORAL_TABLET | Freq: Every day | ORAL | Status: DC
  Administered 2023-03-17 – 2023-03-19 (×3): 1 via ORAL
  Filled 2023-03-17 (×3): qty 1

## 2023-03-17 MED ORDER — DIPHENHYDRAMINE HCL 25 MG PO CAPS: 25.0000 mg | ORAL_CAPSULE | Freq: Four times a day (QID) | ORAL | Status: DC | PRN

## 2023-03-17 MED ORDER — DIBUCAINE (PERIANAL) 1 % EX OINT: 1.0000 | TOPICAL_OINTMENT | CUTANEOUS | Status: DC | PRN

## 2023-03-17 MED ORDER — SENNOSIDES-DOCUSATE SODIUM 8.6-50 MG PO TABS
2.0000 | ORAL_TABLET | Freq: Every day | ORAL | Status: DC
  Administered 2023-03-17 – 2023-03-19 (×3): 2 via ORAL
  Filled 2023-03-17 (×3): qty 2

## 2023-03-17 MED ORDER — IBUPROFEN 600 MG PO TABS
600.0000 mg | ORAL_TABLET | Freq: Four times a day (QID) | ORAL | Status: DC
  Administered 2023-03-17 – 2023-03-19 (×7): 600 mg via ORAL
  Filled 2023-03-17 (×7): qty 1

## 2023-03-17 MED ORDER — ZOLPIDEM TARTRATE 5 MG PO TABS: 5.0000 mg | ORAL_TABLET | Freq: Every evening | ORAL | Status: DC | PRN

## 2023-03-17 MED ORDER — WITCH HAZEL-GLYCERIN EX PADS: 1.0000 | MEDICATED_PAD | CUTANEOUS | Status: DC | PRN

## 2023-03-17 MED ORDER — KETOROLAC TROMETHAMINE 30 MG/ML IJ SOLN
30.0000 mg | Freq: Four times a day (QID) | INTRAMUSCULAR | Status: AC
  Administered 2023-03-17 (×3): 30 mg via INTRAVENOUS
  Filled 2023-03-17 (×3): qty 1

## 2023-03-17 NOTE — Progress Notes (Signed)
Subjective: POD# 1 Information for the patient's newborn:  Sally Cardenas, [place] [[id number]]  female  Baby's Name Sally Cardenas  Reports feeling good, just a little sore Reports tolerating PO and denies N/V, foley removed, ambulating and urinating w/o difficulty  Pain controlled with  PO meds Denies HA/SOB/dizziness  Flatus not passing Vaginal bleeding is normal, no clots     Objective:  VS:  Vitals:   03/17/23 0120 03/17/23 0220 03/17/23 0340 03/17/23 1200  BP: 121/71 117/75 108/71 103/77  Pulse: 82 84 71 73  Resp: 18 16 16 17   Temp: 98.1 F (36.7 C) 98 F (36.7 C) 97.8 F (36.6 C) 97.8 F (36.6 C)  TempSrc: Oral Oral Oral Oral  SpO2: 96% 97% 97% 99%  Weight:      Height:        Intake/Output Summary (Last 24 hours) at 03/17/2023 1334 Last data filed at 03/17/2023 1200 Gross per 24 hour  Intake 2750 ml  Output 2389 ml  Net 361 ml     Recent Labs    03/16/23 0618 03/17/23 0433  WBC 19.0* 26.9*  HGB 13.9 11.4*  HCT 41.6 33.7*  PLT 199 168    Blood type: --/--/O POS (08/22 0615) Rubella: Immune (01/19 0000)    Physical Exam:  General: alert, cooperative, and no distress CV: Regular rate and rhythm or without murmur or extra heart sounds Resp: clear Abdomen: soft, non-tender, hypoactive BS Incision: honeycomb dressing is clean, dry, and intact Perineum:  Uterine Fundus: firm, below umbilicus, nontender Lochia:  appropriate Ext:  neg for pain, tenderness, and cords, 1+ pitting edema noted   Assessment/Plan: 32 y.o.   POD# 1. [id number]                  Principal Problem:   Normal labor   Routine post-op PP care          Advance diet as tolerated Advised warm fluids and ambulation to improve GI motility Breastfeeding support Anticipate D/C on POD day 2 or 3  Roma Schanz, DNP, CNM 03/17/2023, 1:34 PM

## 2023-03-17 NOTE — Anesthesia Postprocedure Evaluation (Signed)
Anesthesia Post Note  Patient: Sally Cardenas  Procedure(s) Performed: CESAREAN SECTION     Patient location during evaluation: PACU Anesthesia Type: Epidural Level of consciousness: awake and alert and oriented Pain management: pain level controlled Vital Signs Assessment: post-procedure vital signs reviewed and stable Respiratory status: spontaneous breathing, nonlabored ventilation and respiratory function stable Cardiovascular status: blood pressure returned to baseline and stable Postop Assessment: no headache, no backache, patient able to bend at knees and epidural receding Anesthetic complications: no   No notable events documented.  Last Vitals:  Vitals:   03/17/23 0120 03/17/23 0220  BP: 121/71 117/75  Pulse: 82 84  Resp: 18 16  Temp: 36.7 C 36.7 C  SpO2: 96% 97%    Last Pain:  Vitals:   03/17/23 0220  TempSrc: Oral  PainSc: 0-No pain                 Lannie Fields

## 2023-03-18 MED ORDER — VALACYCLOVIR HCL 500 MG PO TABS
1000.0000 mg | ORAL_TABLET | Freq: Two times a day (BID) | ORAL | Status: AC
  Administered 2023-03-18 (×2): 1000 mg via ORAL
  Filled 2023-03-18 (×2): qty 2

## 2023-03-18 MED ORDER — POLYETHYLENE GLYCOL 3350 17 G PO PACK
17.0000 g | PACK | Freq: Two times a day (BID) | ORAL | Status: DC
  Filled 2023-03-18 (×2): qty 1

## 2023-03-18 NOTE — Lactation Note (Signed)
This note was copied from a baby's chart. Lactation Consultation Note  Patient Name: Sally Cardenas [id number] Date: 03/18/2023 Age:34 hours Reason for consult: Initial assessment;Primapara;1st time breastfeeding;Infant weight loss;Term;Breastfeeding assistance (5.44% WL)  The infant was at 63 hours old.  Per the birth parent the infant has been doing well with breastfeeding.  The birth parent stated that she does feel some pain with the initial latch but it gets better throughout the feeding.  LC reviewed the outpatient services brochure.  The birth parent had no questions or concerns at the moment.   Infant Feeding Plan: Breastfeed 8+ times in 24 hours according to feeding cues.  Call RN/LC for assistance with breastfeeding.    Maternal Data Has patient been taught Hand Expression?: Yes Does the patient have breastfeeding experience prior to this delivery?: No  Feeding Mother's Current Feeding Choice: Breast Milk  LATCH Score                    Lactation Tools Discussed/Used    Interventions Interventions: LC Services brochure  Discharge Pump: DEBP;Personal  Consult Status Consult Status: Follow-up Date: 03/19/23 Follow-up type: In-patient    Delene Loll 03/18/2023, 3:57 PM

## 2023-03-18 NOTE — Progress Notes (Signed)
Subjective: POD# 2 Information for the patient's newborn:  Sally Cardenas, Sally Cardenas [[id number]]  female  Baby's Name [name]   Reports feeling sore and tired Feeding: breast Reports tolerating PO and denies N/V, foley removed, ambulating and urinating w/o difficulty  Pain controlled with  PO meds Denies HA/SOB/dizziness  Flatus passing Vaginal bleeding is normal, no clots     Objective:  VS:  Vitals:   03/17/23 0340 03/17/23 1200 03/17/23 2110 03/18/23 0633  BP: 108/71 103/77 106/68 109/75  Pulse: 71 73 64 74  Resp: 16 17 18 18   Temp: 97.8 F (36.6 C) 97.8 F (36.6 C) 98 F (36.7 C) 98.2 F (36.8 C)  TempSrc: Oral Oral Oral Oral  SpO2: 97% 99% 99% 99%  Weight:      Height:        Intake/Output Summary (Last 24 hours) at 03/18/2023 0644 Last data filed at 03/17/2023 1200 Gross per 24 hour  Intake --  Output 700 ml  Net -700 ml     Recent Labs    03/16/23 0618 03/17/23 0433  WBC 19.0* 26.9*  HGB 13.9 11.4*  HCT 41.6 33.7*  PLT 199 168    Blood type: --/--/O POS (08/22 0615) Rubella: Immune (01/19 0000)    Physical Exam:  General: alert, cooperative, and mild distress CV: Regular rate and rhythm Resp: clear Abdomen: soft, nontender, normal bowel sounds Incision: clean, dry, and intact Uterine Fundus: firm, below umbilicus, nontender Lochia: minimal Ext:  +1 BLE, neg for pain, tenderness, and cords   Assessment/Plan: 32 y.o.   POD# 2. [id number]                  Principal Problem:   Normal labor   Routine post-op PP care          Advance diet as tolerated Advised warm fluids and ambulation to improve GI motility Breastfeeding support Anticipate D/C on POD #3  Roma Schanz, DNP, CNM 03/18/2023, 6:44 AM

## 2023-03-18 NOTE — Clinical Social Work Maternal (Signed)
CLINICAL SOCIAL WORK MATERNAL/CHILD NOTE  Patient Details  Name: Sally Cardenas MRN: [id number] Date of Birth: 1991-04-11  Date:  03/18/2023  Clinical Social Worker Initiating Note:  Albertine Patricia, LCSWA Date/Time: Initiated:  03/18/23/1613     Child's Name:  Sally Cardenas   Biological Parents:  Mother, Father (FOB: Kerman Passey, DOB: 06/09/1987)   Need for Interpreter:  None   Reason for Referral:  Behavioral Health Concerns   Address:  [address] [place] [address]    Phone number:  [phone] (home) [phone] (work)    Additional phone number:   Household Members/Support Persons (HM/SP):       HM/SP Name Relationship DOB or Age  HM/SP -1        HM/SP -2        HM/SP -3        HM/SP -4        HM/SP -5        HM/SP -6        HM/SP -7        HM/SP -8          Natural Supports (not living in the home):  Friends, Immediate Family, Spouse/significant other   Professional Supports: Therapist   Employment: Full-time   Type of Work: Surveyor, minerals   Education:  Other (comment) (Associate's Degree)   Homebound arranged:    Surveyor, quantity Resources:  Medicaid   Other Resources:  [facility]   Cultural/Religious Considerations Which May Impact Care:  none identified  Strengths:  Ability to meet basic needs  , Home prepared for child  , Pediatrician chosen, Psychotropic Medications   Psychotropic Medications:  Lexapro      Pediatrician:    [employer] [place]  Pediatrician List:   [facility] [employer]    [facility]      Pediatrician Fax Number:    Risk Factors/Current Problems:  Mental Health Concerns     Cognitive State:  Able to Concentrate  , Alert  , Goal Oriented  , Linear Thinking     Mood/Affect:  Comfortable  , Interested  , Calm  , Relaxed     CSW Assessment: CSW was consulted due to history of anxiety, depression, and bipolar  disorder. CSW met with MOB at bedside to complete assessment. When CSW entered room, MOB was observed laying in hospital bed, holding and bonding with infant. FOB was present, sitting nearby. CSW introduced self and requested to speak with MOB alone. MOB provided verbal consent to continue with consult with FOB present. CSW explained reason for consult. FOB inquired if MOB could decline the consult. CSW explained that the consult is intended to be a supportive service and stated that MOB can decline the consult. FOB requested that the consult be declined, stating that MOB has been doing really well emotionally and he does not want her to be triggered by her past. MOB did not comment on FOB's desire to decline consult. CSW explained that it is up to MOB whether or not she would like to meet with CSW. MOB stated that she would like to move forward with consult, FOB appeared agitated and left the room. MOB did not appear distressed by FOB's reaction and presented as calm. MOB confirmed that she was agreeable to consult and remained engaged throughout encounter.   CSW confirmed demographic information listed on file. MOB shares that she resides alone and FOB  lives 5 hours away in [place], [place]. MOB identified her mother and best friend as nearby supports.   CSW inquired how MOB is feeling emotionally since infant's arrival. MOB shares that she has "mostly felt fine", but shared that during her pregnancy, she did endorse some feelings of loneliness and depression a couple weeks ago marked by feelings of overwhelm and irritability. MOB shares that it has been difficulty to work full time and manage her pregnancy with FOB living so far away. CSW provided active listening and emotional support.   CSW inquired about MOB's mental health history. MOB confirms she was diagnosed with depression, anxiety, and bipolar disorder in her early 75's. 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 MOB notes times when she feels more energized than usual and times when she experiences depressive episodes. MOB reports the last time that she endorsed feelings of increased energy was about 1 year ago. MOB reports that she believes that she felt more emotional variability due to using substances and in the past, particularly cocaine and denies substance use in the past year. MOB denies a history of auditory or visual hallucinations. MOB states she is current with a therapist through [facility] [place] who she sees monthly. MOB states she has a follow up appointment with her therapist scheduled for 03/29/2023. MOB reports therapy as helpful. MOB states she used to be prescribed Depakote and Lexapro but discontinued Depakote when she discovered she was pregnant. MOB reports her psychotropic medication is managed through her PCP via [facility]. MOB reports she may begin taking Wellbutrin in addition to Lexapro postpartum and plans to discuss medication changes with the guidance of her OBGYN at her follow up appointment. MOB identified talking to her supports as a healthy coping skill. MOB denied current SI/HI/DV.  CSW provided education regarding the baby blues period vs. perinatal mood disorders, discussed treatment and gave resources for mental health follow up if concerns arise.  CSW recommends self-evaluation during the postpartum time period using the New Mom Checklist from Postpartum Progress and encouraged MOB to contact a medical professional if symptoms are noted at any time.    MOB reports she has all needed items for infant, including a car seat and bassinet. MOB reports she receives [facility] and plans to reapply for food stamps. CSW provided review of Sudden Infant Death Syndrome (SIDS) precautions.  MOB declined additional resource needs at this time.   CSW identifies no further need for intervention and no barriers to discharge at this time.  CSW Plan/Description:  No  Further Intervention Required/No Barriers to Discharge, Sudden Infant Death Syndrome (SIDS) Education, Perinatal Mood and Anxiety Disorder (PMADs) Education    Norberto Sorenson, LCSWA 03/18/2023, 4:15 PM

## 2023-03-19 MED ORDER — OXYCODONE HCL 5 MG PO TABS: 5.0000 mg | ORAL_TABLET | ORAL | 0 refills | Status: DC | PRN

## 2023-03-19 MED ORDER — IBUPROFEN 600 MG PO TABS: 600.0000 mg | ORAL_TABLET | Freq: Four times a day (QID) | ORAL | 0 refills | Status: AC

## 2023-03-19 MED ORDER — ACETAMINOPHEN 500 MG PO TABS: 1000.0000 mg | ORAL_TABLET | Freq: Four times a day (QID) | ORAL | 0 refills | Status: AC

## 2023-03-19 NOTE — Lactation Note (Signed)
This note was copied from a baby's chart. Lactation Consultation Note  Patient Name: Sally Cardenas [id number] Date: 03/19/2023 Age:34 hours Reason for consult: Follow-up assessment;Infant weight loss;Term;Nipple pain/trauma;Primapara;1st time breastfeeding;Breastfeeding assistance Per mom having some soreness initially when latching.  LC offered to assist to see why mom is  having soreness. LC noted some areola edema and cracking at the base of the nipple.  Per mom brought nipple balm, and LC recommended to use a dab at the base of the nipple after feedings and breast shells while  awake between  feedings.  LC reviewed steps for latching and recommended until the discomfort is better.  Baby awake, rooting and LC assisted mom to use the cross cradle and improve the depth. Depth obtained and swallows increased with compressions. Per mom more comfortable than she has been.  LC reviewed BF D/C teaching and the [facility] resources. See below for details.      Maternal Data Has patient been taught Hand Expression?: Yes  Feeding Mother's Current Feeding Choice: Breast Milk  LATCH Score Latch: Grasps breast easily, tongue down, lips flanged, rhythmical sucking.  Audible Swallowing: Spontaneous and intermittent  Type of Nipple: Everted at rest and after stimulation  Comfort (Breast/Nipple): Filling, red/small blisters or bruises, mild/mod discomfort  Hold (Positioning): Assistance needed to correctly position infant at breast and maintain latch.  LATCH Score: 8   Lactation Tools Discussed/Used Tools: Shells;Pump;Flanges Flange Size: 21;24;27 Breast pump type: Manual Pump Education: Milk Storage  Interventions Interventions: Breast feeding basics reviewed;Assisted with latch;Skin to skin;Breast massage;Hand express;Reverse pressure;Breast compression;Adjust position;Support pillows;Position options;Shells;Hand pump;Education;LC Services brochure  Discharge Discharge Education:  Engorgement and breast care;Warning signs for feeding baby Pump: Personal;Hands Free;DEBP;Manual WIC Program: Yes  Consult Status Consult Status: Complete Date: 03/19/23    Kathrin Greathouse 03/19/2023, 11:52 AM

## 2023-03-19 NOTE — Discharge Summary (Signed)
Postpartum Discharge Summary  Date of Service updated8/25/24     Patient Name: Sally Cardenas DOB: 1990-11-05 MRN: [id number]  Date of admission: 03/16/2023 Delivery date:03/16/2023 Delivering provider: Reesa Chew D Date of discharge: 03/19/2023  Admitting diagnosis: Normal labor [O80, Z37.9] Intrauterine pregnancy: [redacted]w[redacted]d     Secondary diagnosis:  Principal Problem:   Normal labor  Additional problems: chorioamnionitis    Discharge diagnosis: Term Pregnancy Delivered                                              Post partum procedures: antibiotics Augmentation: AROM and Pitocin Complications: Intrauterine Inflammation or infection (Chorioamniotis)  Hospital course: Onset of Labor With Unplanned C/S   32 y.o. yo [id number] at [redacted]w[redacted]d was admitted in Latent Labor on 03/16/2023. Patient had a labor course significant for chorio. The patient went for cesarean section due to Arrest of Dilation. Delivery details as follows: Membrane Rupture Time/Date: 11:32 AM,03/16/2023  Delivery Method:C-Section, Low Transverse Operative Delivery:N/A Details of operation can be found in separate operative note. Patient had a postpartum course complicated bychorio.  She is ambulating,tolerating a regular diet, passing flatus, and urinating well.  Patient is discharged home in stable condition 03/19/23.  Newborn Data: Birth date:03/16/2023 Birth time:10:00 PM Gender:Female Living status:Living Apgars:6 ,9  Weight:3310 g  Magnesium Sulfate received: No BMZ received: No Rhophylac:No MMR:No T-DaP:Given postpartum Flu: No Transfusion:No  Physical exam  Vitals:   03/18/23 0633 03/18/23 1515 03/18/23 2110 03/19/23 0607  BP: 109/75 110/75 134/80 124/76  Pulse: 74 68 72 68  Resp: 18 17 18 18   Temp: 98.2 F (36.8 C) 98.2 F (36.8 C) 98.3 F (36.8 C) 98.2 F (36.8 C)  TempSrc: Oral Oral Oral Oral  SpO2: 99% 100% 100% 100%  Weight:      Height:       General: alert and  cooperative Lochia: appropriate Uterine Fundus: firm Incision: Healing well with no significant drainage DVT Evaluation: No evidence of DVT seen on physical exam. Labs: Lab Results  Component Value Date   WBC 26.9 (H) 03/17/2023   HGB 11.4 (L) 03/17/2023   HCT 33.7 (L) 03/17/2023   MCV 77.3 (L) 03/17/2023   PLT 168 03/17/2023      Latest Ref Rng & Units 03/16/2023    6:18 AM  CMP  Glucose 70 - 99 mg/dL 784   BUN 6 - 20 mg/dL 8   Creatinine 6.96 - 2.95 mg/dL 2.84   Sodium 132 - 440 mmol/L 135   Potassium 3.5 - 5.1 mmol/L 3.8   Chloride 98 - 111 mmol/L 102   CO2 22 - 32 mmol/L 21   Calcium 8.9 - 10.3 mg/dL 9.0   Total Protein 6.5 - 8.1 g/dL 6.4   Total Bilirubin 0.3 - 1.2 mg/dL 0.7   Alkaline Phos 38 - 126 U/L 138   AST 15 - 41 U/L 18   ALT 0 - 44 U/L 12    Edinburgh Score:    03/18/2023    8:42 AM  Edinburgh Postnatal Depression Scale Screening Tool  I have been able to laugh and see the funny side of things. 0  I have looked forward with enjoyment to things. 0  I have blamed myself unnecessarily when things went wrong. 1  I have been anxious or worried for no good reason. 1  I have felt scared or  panicky for no good reason. 1  Things have been getting on top of me. 0  I have been so unhappy that I have had difficulty sleeping. 0  I have felt sad or miserable. 1  I have been so unhappy that I have been crying. 0  The thought of harming myself has occurred to me. 0  Edinburgh Postnatal Depression Scale Total 4      After visit meds:  Allergies as of 03/19/2023   No Known Allergies      Medication List     TAKE these medications    acetaminophen 500 MG tablet Commonly known as: TYLENOL Take 2 tablets (1,000 mg total) by mouth every 6 (six) hours.   escitalopram 10 MG tablet Commonly known as: LEXAPRO Take 1 tablet (10 mg total) by mouth daily.   ibuprofen 600 MG tablet Commonly known as: ADVIL Take 1 tablet (600 mg total) by mouth every 6 (six)  hours.   oxyCODONE 5 MG immediate release tablet Commonly known as: Oxy IR/ROXICODONE Take 1-2 tablets (5-10 mg total) by mouth every 4 (four) hours as needed for moderate pain.         Discharge home in stable condition Infant Feeding: Breast Infant Disposition:home with mother Discharge instruction: per After Visit Summary and Postpartum booklet. Activity: Advance as tolerated. Pelvic rest for 6 weeks.  Diet: routine diet Anticipated Birth Control: OCPs Postpartum Appointment:1 week Additional Postpartum F/U: Postpartum Depression checkup Future Appointments:No future appointments. Follow up Visit:  Follow-up Information     [facility] Follow up in 1 week(s).   Specialty: Obstetrics and Gynecology Contact information: [address] [place] [address] [phone]                    03/19/2023 Michael Litter, MD

## 2023-03-20 ENCOUNTER — Ambulatory Visit: Payer: Medicaid Other

## 2023-03-20 LAB — AEROBIC CULTURE W GRAM STAIN (SUPERFICIAL SPECIMEN): Culture: NO GROWTH

## 2023-03-20 LAB — SURGICAL PATHOLOGY

## 2023-03-26 ENCOUNTER — Inpatient Hospital Stay (HOSPITAL_COMMUNITY): Payer: Medicaid Other

## 2023-04-17 ENCOUNTER — Telehealth (HOSPITAL_COMMUNITY): Payer: Self-pay | Admitting: *Deleted

## 2023-04-17 NOTE — Telephone Encounter (Signed)
04/17/2023  Name: Sally Cardenas MRN: [id number] DOB: 11-14-1990  Reason for Call:  Transition of Care Hospital Discharge Call  Contact Status: Patient Contact Status: Complete  Language assistant needed: Interpreter Mode: Interpreter Not Needed        Follow-Up Questions: Do You Have Any Concerns About Your Health As You Heal From Delivery?: No Do You Have Any Concerns About Your Infants Health?: No  Edinburgh Postnatal Depression Scale:  In the Past 7 Days:    PHQ2-9 Depression Scale:     Discharge Follow-up: Edinburgh score requires follow up?:  (Patient declined screening today.  Said she was screened recently at the peds office with a low score and she feels she is doing well emotionally.  She mentioned that she is currently in therapy.)  Post-discharge interventions: Reviewed Newborn Safe Sleep Practices  Salena Saner, RN 04/17/2023 14:08

## 2023-06-01 ENCOUNTER — Emergency Department (HOSPITAL_COMMUNITY)
Admission: EM | Admit: 2023-06-01 | Discharge: 2023-06-01 | Disposition: A | Discharge status: Home / Self Care | Payer: Medicaid Other | Attending: Emergency Medicine | Admitting: Emergency Medicine

## 2023-06-01 ENCOUNTER — Encounter (HOSPITAL_COMMUNITY): Payer: Self-pay

## 2023-06-01 DIAGNOSIS — N61 Mastitis without abscess: Secondary | ICD-10-CM | POA: Diagnosis not present

## 2023-06-01 DIAGNOSIS — N644 Mastodynia: Secondary | ICD-10-CM | POA: Diagnosis present

## 2023-06-01 MED ORDER — HYDROCODONE-ACETAMINOPHEN 5-325 MG PO TABS: 1.0000 | ORAL_TABLET | ORAL | 0 refills | Status: DC | PRN

## 2023-06-01 MED ORDER — CEFTRIAXONE SODIUM 1 G IJ SOLR
1.0000 g | Freq: Once | INTRAMUSCULAR | Status: AC
  Administered 2023-06-01: 1 g via INTRAMUSCULAR
  Filled 2023-06-01: qty 10

## 2023-06-01 MED ORDER — LIDOCAINE HCL (PF) 1 % IJ SOLN
2.1000 mL | Freq: Once | INTRAMUSCULAR | Status: AC
  Administered 2023-06-01: 2.1 mL
  Filled 2023-06-01: qty 30

## 2023-06-01 NOTE — ED Triage Notes (Signed)
Pt presents with c/o right breast pain. Pt has had a clogged milk duct for several days. Pt reports she is on antibiotics for this, but has now been vomiting, chills, and general malaise.

## 2023-06-01 NOTE — ED Provider Notes (Signed)
[place] EMERGENCY DEPARTMENT AT [facility] Provider Note   CSN: [id number] Arrival date & time: 06/01/23  1030     History  Chief Complaint  Patient presents with   Breast Pain    Sally Cardenas is a 32 y.o. female.  Patient complains of swelling to her right breast.  Patient has been diagnosed with mastitis.  She was started on antibiotics 2 days ago.  Patient reports increased redness and swelling.  Patient reports she has experienced some chills.  Patient denies any other complaints she has not had a cough or congestion she denies any abdominal pain.  No other areas of infection  The history is provided by the patient. No language interpreter was used.       Home Medications Prior to Admission medications   Medication Sig Start Date End Date Taking? Authorizing Provider  acetaminophen (TYLENOL) 500 MG tablet Take 2 tablets (1,000 mg total) by mouth every 6 (six) hours. 03/19/23   Jaymes Graff, MD  escitalopram (LEXAPRO) 10 MG tablet Take 1 tablet (10 mg total) by mouth daily. 06/07/21   Charm Rings, NP  ibuprofen (ADVIL) 600 MG tablet Take 1 tablet (600 mg total) by mouth every 6 (six) hours. 03/19/23   Jaymes Graff, MD  oxyCODONE (OXY IR/ROXICODONE) 5 MG immediate release tablet Take 1-2 tablets (5-10 mg total) by mouth every 4 (four) hours as needed for moderate pain. 03/19/23   Jaymes Graff, MD      Allergies    Patient has no known allergies.    Review of Systems   Review of Systems  Skin:  Positive for color change.  All other systems reviewed and are negative.   Physical Exam Updated Vital Signs BP 123/86 (BP Location: Left Arm)   Pulse 94   Temp 98.1 F (36.7 C) (Oral)   Resp 18   LMP 06/12/2022 (Exact Date)   SpO2 100%   Breastfeeding Yes  Physical Exam Vitals and nursing note reviewed.  Constitutional:      Appearance: She is well-developed.  HENT:     Head: Normocephalic.  Cardiovascular:     Rate and Rhythm: Normal rate.   Pulmonary:     Effort: Pulmonary effort is normal.  Abdominal:     General: There is no distension.  Musculoskeletal:        General: Normal range of motion.     Cervical back: Normal range of motion.  Skin:    General: Skin is warm.     Comments: Right breast red swollen right outer quadrant, tender to palpation.  Neurological:     General: No focal deficit present.     Mental Status: She is alert and oriented to person, place, and time.     ED Results / Procedures / Treatments   Labs (all labs ordered are listed, but only abnormal results are displayed) Labs Reviewed - No data to display  EKG None  Radiology No results found.  Procedures Procedures    Medications Ordered in ED Medications  cefTRIAXone (ROCEPHIN) injection 1 g (has no administration in time range)    ED Course/ Medical Decision Making/ A&P                                 Medical Decision Making Risk Prescription drug management.   Patient is on appropriate treatment.  Patient may have some helping cellulitis on top of the mastitis.  Again  will give the patient Rocephin 1 g.  Patient is advised to return if symptoms worsen or change.  Patient is advised to continue symptomatic treatment        Final Clinical Impression(s) / ED Diagnoses Final diagnoses:  Mastitis    Rx / DC Orders ED Discharge Orders     None     An After Visit Summary was printed and given to the patient.     Elson Areas, PA-C 06/01/23 2210    Lonell Grandchild, MD 06/02/23 [phone]

## 2023-06-01 NOTE — Discharge Instructions (Signed)
Return if any problems.

## 2023-06-04 ENCOUNTER — Other Ambulatory Visit: Payer: Self-pay

## 2023-06-04 ENCOUNTER — Encounter (HOSPITAL_COMMUNITY): Payer: Self-pay | Admitting: Emergency Medicine

## 2023-06-04 ENCOUNTER — Emergency Department (HOSPITAL_COMMUNITY)
Admission: EM | Admit: 2023-06-04 | Discharge: 2023-06-04 | Disposition: A | Discharge status: Home / Self Care | Payer: Medicaid Other | Attending: Emergency Medicine | Admitting: Emergency Medicine

## 2023-06-04 DIAGNOSIS — N61 Mastitis without abscess: Secondary | ICD-10-CM

## 2023-06-04 DIAGNOSIS — D72829 Elevated white blood cell count, unspecified: Secondary | ICD-10-CM | POA: Diagnosis not present

## 2023-06-04 DIAGNOSIS — J45909 Unspecified asthma, uncomplicated: Secondary | ICD-10-CM | POA: Insufficient documentation

## 2023-06-04 DIAGNOSIS — N644 Mastodynia: Secondary | ICD-10-CM | POA: Diagnosis present

## 2023-06-04 DIAGNOSIS — N611 Abscess of the breast and nipple: Secondary | ICD-10-CM | POA: Insufficient documentation

## 2023-06-04 LAB — CBC WITH DIFFERENTIAL/PLATELET
Abs Immature Granulocytes: 0.12 10*3/uL — ABNORMAL HIGH (ref 0.00–0.07)
Basophils Absolute: 0.1 10*3/uL (ref 0.0–0.1)
Basophils Relative: 0 %
Eosinophils Absolute: 0.8 10*3/uL — ABNORMAL HIGH (ref 0.0–0.5)
Eosinophils Relative: 4 %
HCT: 37.8 % (ref 36.0–46.0)
Hemoglobin: 12.6 g/dL (ref 12.0–15.0)
Immature Granulocytes: 1 %
Lymphocytes Relative: 7 %
Lymphs Abs: 1.5 10*3/uL (ref 0.7–4.0)
MCH: 24.5 pg — ABNORMAL LOW (ref 26.0–34.0)
MCHC: 33.3 g/dL (ref 30.0–36.0)
MCV: 73.5 fL — ABNORMAL LOW (ref 80.0–100.0)
Monocytes Absolute: 1.4 10*3/uL — ABNORMAL HIGH (ref 0.1–1.0)
Monocytes Relative: 7 %
Neutro Abs: 15.9 10*3/uL — ABNORMAL HIGH (ref 1.7–7.7)
Neutrophils Relative %: 81 %
Platelets: 335 10*3/uL (ref 150–400)
RBC: 5.14 MIL/uL — ABNORMAL HIGH (ref 3.87–5.11)
RDW: 14.2 % (ref 11.5–15.5)
WBC: 19.8 10*3/uL — ABNORMAL HIGH (ref 4.0–10.5)
nRBC: 0 % (ref 0.0–0.2)

## 2023-06-04 MED ORDER — CEPHALEXIN 250 MG PO CAPS: 500.0000 mg | ORAL_CAPSULE | Freq: Four times a day (QID) | ORAL | 0 refills | Status: DC

## 2023-06-04 MED ORDER — MORPHINE SULFATE (PF) 2 MG/ML IV SOLN
2.0000 mg | Freq: Once | INTRAVENOUS | Status: AC
  Administered 2023-06-04: 2 mg via INTRAVENOUS
  Filled 2023-06-04: qty 1

## 2023-06-04 MED ORDER — HYDROCODONE-ACETAMINOPHEN 5-325 MG PO TABS
1.0000 | ORAL_TABLET | Freq: Once | ORAL | Status: AC
  Administered 2023-06-04: 1 via ORAL
  Filled 2023-06-04: qty 1

## 2023-06-04 MED ORDER — ONDANSETRON 4 MG PO TBDP
4.0000 mg | ORAL_TABLET | Freq: Once | ORAL | Status: AC
  Administered 2023-06-04: 4 mg via ORAL
  Filled 2023-06-04: qty 1

## 2023-06-04 MED ORDER — SODIUM CHLORIDE 0.9 % IV SOLN
1.0000 g | Freq: Once | INTRAVENOUS | Status: AC
  Administered 2023-06-04: 1 g via INTRAVENOUS
  Filled 2023-06-04: qty 10

## 2023-06-04 MED ORDER — CEPHALEXIN 250 MG PO CAPS: 500.0000 mg | ORAL_CAPSULE | Freq: Four times a day (QID) | ORAL | 0 refills | Status: AC

## 2023-06-04 MED ORDER — HYDROCODONE-ACETAMINOPHEN 5-325 MG PO TABS: 1.0000 | ORAL_TABLET | Freq: Four times a day (QID) | ORAL | 0 refills | Status: AC | PRN

## 2023-06-04 MED ORDER — MORPHINE SULFATE (PF) 4 MG/ML IV SOLN
4.0000 mg | Freq: Once | INTRAVENOUS | Status: AC
  Administered 2023-06-04: 4 mg via INTRAVENOUS
  Filled 2023-06-04: qty 1

## 2023-06-04 NOTE — ED Notes (Signed)
Waiting on antibiotic to finish before e discharge

## 2023-06-04 NOTE — ED Provider Notes (Signed)
[place] EMERGENCY DEPARTMENT AT [facility] Provider Note   CSN: [id number] Arrival date & time: 06/04/23  1354     History {Add pertinent medical, surgical, social history, OB history to HPI:1} Chief Complaint  Patient presents with  . Mastitis    Sally Cardenas is a 32 y.o. female past medical history significant for asthma presents today for right breast pain.  Patient states she was seen on 10/25 initially and diagnosed with mastitis.  She was then seen on 05/30/2023 and given dicloxacillin for 10 days.  She was also seen on 11/7 and given a shot of Rocephin.  She endorses right lateral breast pain, chills, nausea and fever.  Patient denies vomiting, diarrhea, abdominal pain, or shortness of breath.  Patient states she is still breast-feeding on this side.  HPI     Home Medications Prior to Admission medications   Medication Sig Start Date End Date Taking? Authorizing Provider  acetaminophen (TYLENOL) 500 MG tablet Take 2 tablets (1,000 mg total) by mouth every 6 (six) hours. 03/19/23   Jaymes Graff, MD  escitalopram (LEXAPRO) 10 MG tablet Take 1 tablet (10 mg total) by mouth daily. 06/07/21   Charm Rings, NP  HYDROcodone-acetaminophen (NORCO/VICODIN) 5-325 MG tablet Take 1 tablet by mouth every 4 (four) hours as needed for moderate pain (pain score 4-6). 06/01/23 05/31/24  Elson Areas, PA-C  ibuprofen (ADVIL) 600 MG tablet Take 1 tablet (600 mg total) by mouth every 6 (six) hours. 03/19/23   Jaymes Graff, MD  oxyCODONE (OXY IR/ROXICODONE) 5 MG immediate release tablet Take 1-2 tablets (5-10 mg total) by mouth every 4 (four) hours as needed for moderate pain. 03/19/23   Jaymes Graff, MD      Allergies    Patient has no known allergies.    Review of Systems   Review of Systems  Constitutional:  Positive for chills and fever.  Skin:        Right breast erythema    Physical Exam Updated Vital Signs BP 131/83   Pulse 90   Temp 98 F (36.7 C)  (Oral)   Resp 18   LMP 06/12/2022 (Exact Date)   SpO2 100%   Breastfeeding Yes  Physical Exam Vitals and nursing note reviewed. Exam conducted with a chaperone present.  Constitutional:      General: She is not in acute distress.    Appearance: She is well-developed.  HENT:     Head: Normocephalic and atraumatic.     Right Ear: External ear normal.     Left Ear: External ear normal.     Nose: Nose normal.  Eyes:     Conjunctiva/sclera: Conjunctivae normal.  Cardiovascular:     Rate and Rhythm: Normal rate and regular rhythm.     Heart sounds: No murmur heard. Pulmonary:     Effort: Pulmonary effort is normal. No respiratory distress.     Breath sounds: Normal breath sounds.  Abdominal:     Palpations: Abdomen is soft.     Tenderness: There is no abdominal tenderness.  Musculoskeletal:        General: No swelling.     Cervical back: Neck supple.  Skin:    General: Skin is warm and dry.     Capillary Refill: Capillary refill takes less than 2 seconds.          Comments: Right breast erythema along the inferior lateral side.  Patient has tenderness to touch.  Approximately 5 cm area of fluctuance.  Neurological:  General: No focal deficit present.     Mental Status: She is alert.  Psychiatric:        Mood and Affect: Mood normal.    ED Results / Procedures / Treatments   Labs (all labs ordered are listed, but only abnormal results are displayed) Labs Reviewed  CBC WITH DIFFERENTIAL/PLATELET    EKG None  Radiology No results found.  Procedures Procedures  {Document cardiac monitor, telemetry assessment procedure when appropriate:1}  Medications Ordered in ED Medications  ondansetron (ZOFRAN-ODT) disintegrating tablet 4 mg (has no administration in time range)  HYDROcodone-acetaminophen (NORCO/VICODIN) 5-325 MG per tablet 1 tablet (has no administration in time range)    ED Course/ Medical Decision Making/ A&P   {   Click here for ABCD2, HEART and  other calculatorsREFRESH Note before signing :1}                              Medical Decision Making Amount and/or Complexity of Data Reviewed Labs: ordered.  Risk Prescription drug management.   This patient presents to the ED with chief complaint(s) of right breast pain with pertinent past medical history of asthma which further complicates the presenting complaint. The complaint involves an extensive differential diagnosis and also carries with it a high risk of complications and morbidity.    The differential diagnosis includes mastitis, mastitis with abscess  Additional history obtained: Records reviewed Care Everywhere/External Records  ED Course and Reassessment: Patient given Norco and Zofran for pain and nausea.  Independent labs interpretation:  The following labs were independently interpreted:  CBC:   Consultation: - Consulted or discussed management/test interpretation w/ external professional: None  Consideration for admission or further workup: ***   {Document critical care time when appropriate:1} {Document review of labs and clinical decision tools ie heart score, Chads2Vasc2 etc:1}  {Document your independent review of radiology images, and any outside records:1} {Document your discussion with family members, caretakers, and with consultants:1} {Document social determinants of health affecting pt's care:1} {Document your decision making why or why not admission, treatments were needed:1} Final Clinical Impression(s) / ED Diagnoses Final diagnoses:  None    Rx / DC Orders ED Discharge Orders     None

## 2023-06-04 NOTE — Discharge Instructions (Addendum)
Today you were seen for mastitis.  Please pick up your antibiotic and take as prescribed.  Please discontinue your dicloxacillin.  Follow-up with your OB/GYN in the next week.  Thank you for letting us treat you today. After performing a physical exam and reviewing your labs, I feel you are safe to go home. Please follow up with your PCP in the next several days and provide them with your records from this visit. Return to the Emergency Room if pain becomes severe or symptoms worsen.

## 2023-06-04 NOTE — ED Triage Notes (Signed)
Patient arrives ambulatory by POV c/o right breast pain x 3 weeks. States she was diagnosed with mastitis and currently on antibiotics.

## 2023-06-09 LAB — AEROBIC/ANAEROBIC CULTURE W GRAM STAIN (SURGICAL/DEEP WOUND)

## 2023-06-10 ENCOUNTER — Telehealth (HOSPITAL_BASED_OUTPATIENT_CLINIC_OR_DEPARTMENT_OTHER): Payer: Self-pay | Admitting: *Deleted

## 2023-06-10 NOTE — Telephone Encounter (Signed)
Post ED Visit - Positive Culture Follow-up  Culture report reviewed by antimicrobial stewardship pharmacist: [facility] []  Enzo Bi, Pharm.D. []  Celedonio Miyamoto, Pharm.D., BCPS AQ-ID []  Garvin Fila, Pharm.D., BCPS []  Georgina Pillion, Pharm.D., BCPS []  [place], [place].D., BCPS, AAHIVP []  Estella Husk, Pharm.D., BCPS, AAHIVP []  Lysle Pearl, PharmD, BCPS []  Phillips Climes, PharmD, BCPS []  Agapito Games, PharmD, BCPS []  Verlan Friends, PharmD []  Mervyn Gay, PharmD, BCPS []  Vinnie Level, PharmD  [facility] []  Len Childs, PharmD []  Greer Pickerel, PharmD []  Adalberto Cole, PharmD []  Perlie Gold, Rph []  Lonell Face) Jean Rosenthal, PharmD []  Earl Many, PharmD []  Junita Push, PharmD []  Dorna Leitz, PharmD []  Terrilee Files, PharmD [x]  Lynann Beaver, PharmD []  Keturah Barre, PharmD []  Loralee Pacas, PharmD []  Bernadene Person, PharmD   Positive aerobic/ anaerobic culture Treated with Cephalexin, organism sensitive to the same and no further patient follow-up is required at this time.  Patsey Berthold 06/10/2023, 12:30 PM

## 2023-06-14 ENCOUNTER — Emergency Department (HOSPITAL_COMMUNITY)
Admission: EM | Admit: 2023-06-14 | Discharge: 2023-06-14 | Disposition: A | Discharge status: Home / Self Care | Payer: Medicaid Other | Attending: Emergency Medicine | Admitting: Emergency Medicine

## 2023-06-14 ENCOUNTER — Encounter (HOSPITAL_COMMUNITY): Payer: Self-pay

## 2023-06-14 DIAGNOSIS — N644 Mastodynia: Secondary | ICD-10-CM | POA: Diagnosis present

## 2023-06-14 LAB — CBC WITH DIFFERENTIAL/PLATELET
Abs Immature Granulocytes: 0.05 10*3/uL (ref 0.00–0.07)
Basophils Absolute: 0.1 10*3/uL (ref 0.0–0.1)
Basophils Relative: 1 %
Eosinophils Absolute: 0.5 10*3/uL (ref 0.0–0.5)
Eosinophils Relative: 4 %
HCT: 41.3 % (ref 36.0–46.0)
Hemoglobin: 13.2 g/dL (ref 12.0–15.0)
Immature Granulocytes: 0 %
Lymphocytes Relative: 17 %
Lymphs Abs: 2.2 10*3/uL (ref 0.7–4.0)
MCH: 24 pg — ABNORMAL LOW (ref 26.0–34.0)
MCHC: 32 g/dL (ref 30.0–36.0)
MCV: 75.1 fL — ABNORMAL LOW (ref 80.0–100.0)
Monocytes Absolute: 0.6 10*3/uL (ref 0.1–1.0)
Monocytes Relative: 5 %
Neutro Abs: 9.1 10*3/uL — ABNORMAL HIGH (ref 1.7–7.7)
Neutrophils Relative %: 73 %
Platelets: 397 10*3/uL (ref 150–400)
RBC: 5.5 MIL/uL — ABNORMAL HIGH (ref 3.87–5.11)
RDW: 15.3 % (ref 11.5–15.5)
WBC: 12.5 10*3/uL — ABNORMAL HIGH (ref 4.0–10.5)
nRBC: 0 % (ref 0.0–0.2)

## 2023-06-14 LAB — BASIC METABOLIC PANEL
Anion gap: 10 (ref 5–15)
BUN: 18 mg/dL (ref 6–20)
CO2: 24 mmol/L (ref 22–32)
Calcium: 9.5 mg/dL (ref 8.9–10.3)
Chloride: 102 mmol/L (ref 98–111)
Creatinine, Ser: 0.71 mg/dL (ref 0.44–1.00)
GFR, Estimated: >60 mL/min (ref >60)
Glucose, Bld: 137 mg/dL — ABNORMAL HIGH (ref 70–99)
Potassium: 3.9 mmol/L (ref 3.5–5.1)
Sodium: 136 mmol/L (ref 135–145)

## 2023-06-14 NOTE — Discharge Instructions (Addendum)
Today you were seen for right breast pain.  Please finish your antibiotics.  Please call [facility] and schedule an appointment.  Thank you for letting us treat you today. After performing a physical exam reviewing your labs, I feel you are safe to go home. Please follow up with your PCP in the next several days and provide them with your records from this visit. Return to the Emergency Room if pain becomes severe or symptoms worsen.

## 2023-06-14 NOTE — ED Triage Notes (Signed)
Pt c/o recurrent R breast pain x4.5 weeks.  Pain score 2/10.  Pt has been seen for the same and diagnosed w/ mastitis and then an abscess.  Pt is getting ready to complete 2nd antibiotic.  Sts she is starting to feel bad again.

## 2023-06-14 NOTE — ED Provider Notes (Signed)
[place] EMERGENCY DEPARTMENT AT [facility] Provider Note   CSN: [id number] Arrival date & time: 06/14/23  1153     History  Chief Complaint  Patient presents with   Breast Pain   Abscess   Chills    Zamoria Council is a 32 y.o. female past medical history significant for recent mastitis with abscess.  Patient has been dealing with this issue for approximately 4-1/2 weeks now.  She is currently taking Keflex prescribed by myself on 06/04/2023 and has 2 days left.  She states she was initially feeling better but today at work she began feeling chills and pain in the breast again.  She endorses 2 episodes of diarrhea beginning last night.  Patient denies nausea, vomiting, drainage from breast, fever, dysuria, abdominal pain hematuria, or blood in stool.  Abscess      Home Medications Prior to Admission medications   Medication Sig Start Date End Date Taking? Authorizing Provider  acetaminophen (TYLENOL) 500 MG tablet Take 2 tablets (1,000 mg total) by mouth every 6 (six) hours. 03/19/23   Jaymes Graff, MD  cephALEXin (KEFLEX) 250 MG capsule Take 2 capsules (500 mg total) by mouth 4 (four) times daily for 12 days. 06/04/23 06/16/23  Dolphus Jenny, PA-C  escitalopram (LEXAPRO) 10 MG tablet Take 1 tablet (10 mg total) by mouth daily. 06/07/21   Charm Rings, NP  ibuprofen (ADVIL) 600 MG tablet Take 1 tablet (600 mg total) by mouth every 6 (six) hours. 03/19/23   Jaymes Graff, MD      Allergies    Patient has no known allergies.    Review of Systems   Review of Systems  Constitutional:  Positive for chills.  Skin:        Right breast pain    Physical Exam Updated Vital Signs BP 107/72   Pulse 62   Temp 98.4 F (36.9 C) (Oral)   Resp 18   SpO2 97%  Physical Exam Vitals and nursing note reviewed.  Constitutional:      General: She is not in acute distress.    Appearance: Normal appearance. She is well-developed. She is not ill-appearing or  toxic-appearing.  HENT:     Head: Normocephalic and atraumatic.     Right Ear: External ear normal.     Left Ear: External ear normal.  Eyes:     Extraocular Movements: Extraocular movements intact.     Conjunctiva/sclera: Conjunctivae normal.  Cardiovascular:     Rate and Rhythm: Normal rate and regular rhythm.     Heart sounds: No murmur heard. Pulmonary:     Effort: Pulmonary effort is normal. No respiratory distress.     Breath sounds: Normal breath sounds.  Abdominal:     General: Abdomen is flat.     Palpations: Abdomen is soft.     Tenderness: There is no abdominal tenderness.  Musculoskeletal:        General: No swelling.     Cervical back: Neck supple.  Skin:    General: Skin is warm and dry.     Capillary Refill: Capillary refill takes less than 2 seconds.  Neurological:     General: No focal deficit present.     Mental Status: She is alert.  Psychiatric:        Mood and Affect: Mood normal.     ED Results / Procedures / Treatments   Labs (all labs ordered are listed, but only abnormal results are displayed) Labs Reviewed  CBC WITH DIFFERENTIAL/PLATELET -  Abnormal; Notable for the following components:      Result Value   WBC 12.5 (*)    RBC 5.50 (*)    MCV 75.1 (*)    MCH 24.0 (*)    Neutro Abs 9.1 (*)    All other components within normal limits  BASIC METABOLIC PANEL - Abnormal; Notable for the following components:   Glucose, Bld 137 (*)    All other components within normal limits    EKG None  Radiology No results found.  Procedures Procedures    Medications Ordered in ED Medications - No data to display  ED Course/ Medical Decision Making/ A&P                                 Medical Decision Making Amount and/or Complexity of Data Reviewed Labs: ordered. Radiology: ordered.   This patient presents to the ED with chief complaint(s) of breast pain and chills with pertinent past medical history of mastitis with abscess which further  complicates the presenting complaint. The complaint involves an extensive differential diagnosis and also carries with it a high risk of complications and morbidity.    The differential diagnosis includes mastitis, abscess, viral illness   ED Course and Reassessment:   Independent labs interpretation:  The following labs were independently interpreted:  CBC: Leukocytosis at 12.5 which is improved prior to 10 days ago at 19.8 BMP: No notable findings  Consultation: - Consulted or discussed management/test interpretation w/ external professional: General Surgery, Dr. Fredricka Bonine recommended follow-up imaging with the breast center.  Consideration for admission or further workup: Supervision for the workup however patient vital signs remained stable throughout ER stay.  Patient's physical exam and labs are reassuring.  Patient will have follow-up imaging with the breast center and care with [facility]        Final Clinical Impression(s) / ED Diagnoses Final diagnoses:  Breast pain    Rx / DC Orders ED Discharge Orders          Ordered    US BREAST ASPIRATION RIGHT        06/14/23 1702    [place] LIMITED ULTRASOUND INCLUDING AXILLA RIGHT BREAST        06/14/23 1702    MM 3D DIAGNOSTIC MAMMOGRAM BILATERAL BREAST        06/14/23 1702              Dolphus Jenny, PA-C 06/14/23 1712    Melene Plan, DO 06/15/23 [phone]

## 2023-06-15 ENCOUNTER — Other Ambulatory Visit: Payer: Self-pay | Admitting: Nurse Practitioner

## 2023-06-15 ENCOUNTER — Encounter: Payer: Self-pay | Admitting: Nurse Practitioner

## 2023-06-15 DIAGNOSIS — N611 Abscess of the breast and nipple: Secondary | ICD-10-CM

## 2023-06-19 ENCOUNTER — Ambulatory Visit
Admission: RE | Admit: 2023-06-19 | Discharge: 2023-06-19 | Disposition: A | Discharge status: Home / Self Care | Payer: Medicaid Other | Source: Ambulatory Visit | Attending: Nurse Practitioner | Admitting: Nurse Practitioner

## 2023-06-19 ENCOUNTER — Other Ambulatory Visit: Payer: Medicaid Other

## 2023-06-19 DIAGNOSIS — N611 Abscess of the breast and nipple: Secondary | ICD-10-CM

## 2023-06-20 ENCOUNTER — Other Ambulatory Visit: Payer: Self-pay | Admitting: Nurse Practitioner

## 2023-06-20 DIAGNOSIS — N61 Mastitis without abscess: Secondary | ICD-10-CM

## 2023-06-27 ENCOUNTER — Ambulatory Visit
Admission: RE | Admit: 2023-06-27 | Discharge: 2023-06-27 | Disposition: A | Discharge status: Home / Self Care | Payer: Medicaid Other | Source: Ambulatory Visit | Attending: Nurse Practitioner | Admitting: Nurse Practitioner

## 2023-06-27 DIAGNOSIS — N61 Mastitis without abscess: Secondary | ICD-10-CM

## 2023-11-28 ENCOUNTER — Encounter (HOSPITAL_COMMUNITY): Payer: Self-pay

## 2023-12-01 ENCOUNTER — Ambulatory Visit (HOSPITAL_COMMUNITY): Admitting: Licensed Clinical Social Worker
